# Patient Record
Sex: Female | Born: 1970 | Race: Black or African American | Hispanic: No | Marital: Single | Smoking: Never smoker
Health system: Southern US, Community
[De-identification: ages and names within clinical notes are randomized; demographics above are authoritative.]

## PROBLEM LIST (undated history)

## (undated) ENCOUNTER — Emergency Department (HOSPITAL_COMMUNITY): Admission: EM | Payer: BC Managed Care – PPO | Source: Home / Self Care

## (undated) DIAGNOSIS — T7840XA Allergy, unspecified, initial encounter: Secondary | ICD-10-CM

## (undated) DIAGNOSIS — I10 Essential (primary) hypertension: Secondary | ICD-10-CM

## (undated) DIAGNOSIS — F419 Anxiety disorder, unspecified: Secondary | ICD-10-CM

## (undated) DIAGNOSIS — D509 Iron deficiency anemia, unspecified: Secondary | ICD-10-CM

## (undated) DIAGNOSIS — F32A Depression, unspecified: Secondary | ICD-10-CM

## (undated) DIAGNOSIS — R7303 Prediabetes: Secondary | ICD-10-CM

## (undated) DIAGNOSIS — D3500 Benign neoplasm of unspecified adrenal gland: Secondary | ICD-10-CM

## (undated) DIAGNOSIS — Z9289 Personal history of other medical treatment: Secondary | ICD-10-CM

## (undated) DIAGNOSIS — E669 Obesity, unspecified: Secondary | ICD-10-CM

## (undated) DIAGNOSIS — N920 Excessive and frequent menstruation with regular cycle: Secondary | ICD-10-CM

## (undated) DIAGNOSIS — K219 Gastro-esophageal reflux disease without esophagitis: Secondary | ICD-10-CM

## (undated) HISTORY — DX: Iron deficiency anemia, unspecified: D50.9

## (undated) HISTORY — DX: Gastro-esophageal reflux disease without esophagitis: K21.9

## (undated) HISTORY — DX: Allergy, unspecified, initial encounter: T78.40XA

## (undated) HISTORY — DX: Prediabetes: R73.03

## (undated) HISTORY — PX: COLONOSCOPY: SHX174

## (undated) HISTORY — DX: Depression, unspecified: F32.A

## (undated) HISTORY — DX: Benign neoplasm of unspecified adrenal gland: D35.00

## (undated) HISTORY — PX: UPPER GASTROINTESTINAL ENDOSCOPY: SHX188

## (undated) HISTORY — DX: Obesity, unspecified: E66.9

## (undated) HISTORY — DX: Excessive and frequent menstruation with regular cycle: N92.0

## (undated) HISTORY — DX: Personal history of other medical treatment: Z92.89

---
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
## 1994-10-27 HISTORY — PX: OTHER SURGICAL HISTORY: SHX169

## 1998-02-26 ENCOUNTER — Inpatient Hospital Stay (HOSPITAL_COMMUNITY): Admission: AD | Admit: 1998-02-26 | Discharge: 1998-02-27 | Payer: Self-pay | Admitting: *Deleted

## 1998-03-02 ENCOUNTER — Inpatient Hospital Stay (HOSPITAL_COMMUNITY): Admission: AD | Admit: 1998-03-02 | Discharge: 1998-03-02 | Payer: Self-pay | Admitting: *Deleted

## 2000-11-17 ENCOUNTER — Emergency Department (HOSPITAL_COMMUNITY): Admission: EM | Admit: 2000-11-17 | Discharge: 2000-11-17 | Payer: Self-pay | Admitting: *Deleted

## 2004-10-27 HISTORY — PX: OTHER SURGICAL HISTORY: SHX169

## 2010-05-12 ENCOUNTER — Ambulatory Visit: Payer: Self-pay | Admitting: Gynecology

## 2010-05-12 ENCOUNTER — Inpatient Hospital Stay (HOSPITAL_COMMUNITY): Admission: AD | Admit: 2010-05-12 | Discharge: 2010-05-12 | Payer: Self-pay | Admitting: Obstetrics and Gynecology

## 2010-05-16 ENCOUNTER — Emergency Department (HOSPITAL_COMMUNITY): Admission: EM | Admit: 2010-05-16 | Discharge: 2010-05-16 | Payer: Self-pay | Admitting: Emergency Medicine

## 2010-05-26 ENCOUNTER — Emergency Department (HOSPITAL_COMMUNITY): Admission: EM | Admit: 2010-05-26 | Discharge: 2010-05-26 | Payer: Self-pay | Admitting: Emergency Medicine

## 2010-05-29 ENCOUNTER — Ambulatory Visit: Payer: Self-pay | Admitting: Internal Medicine

## 2010-06-05 LAB — COMPREHENSIVE METABOLIC PANEL
ALT: 32 U/L (ref 0–35)
AST: 22 U/L (ref 0–37)
Albumin: 4.5 g/dL (ref 3.5–5.2)
Alkaline Phosphatase: 116 U/L (ref 39–117)
Potassium: 4.7 mEq/L (ref 3.5–5.3)
Total Bilirubin: 0.3 mg/dL (ref 0.3–1.2)

## 2010-06-05 LAB — CBC WITH DIFFERENTIAL/PLATELET
Basophils Absolute: 0.1 10*3/uL (ref 0.0–0.1)
MCHC: 31.6 g/dL (ref 31.5–36.0)
Platelets: 390 10*3/uL (ref 145–400)

## 2010-06-05 LAB — FERRITIN: Ferritin: 30 ng/mL (ref 10–291)

## 2010-06-05 LAB — LACTATE DEHYDROGENASE: LDH: 151 U/L (ref 94–250)

## 2010-06-26 ENCOUNTER — Ambulatory Visit: Payer: Self-pay | Admitting: Obstetrics & Gynecology

## 2010-07-25 ENCOUNTER — Ambulatory Visit: Payer: Self-pay | Admitting: Internal Medicine

## 2010-07-29 LAB — CBC WITH DIFFERENTIAL/PLATELET
BASO%: 0.7 % (ref 0.0–2.0)
Basophils Absolute: 0 10*3/uL (ref 0.0–0.1)
EOS%: 1.4 % (ref 0.0–7.0)
Eosinophils Absolute: 0.1 10*3/uL (ref 0.0–0.5)
LYMPH%: 24 % (ref 14.0–49.7)
MCHC: 33.6 g/dL (ref 31.5–36.0)
MCV: 81.8 fL (ref 79.5–101.0)
MONO#: 0.5 10*3/uL (ref 0.1–0.9)
MONO%: 8.3 % (ref 0.0–14.0)
NEUT#: 4.1 10*3/uL (ref 1.5–6.5)
NEUT%: 65.6 % (ref 38.4–76.8)
RDW: 19.1 % — ABNORMAL HIGH (ref 11.2–14.5)
WBC: 6.2 10*3/uL (ref 3.9–10.3)
lymph#: 1.5 10*3/uL (ref 0.9–3.3)

## 2010-07-29 LAB — IRON AND TIBC
%SAT: 31 % (ref 20–55)
Iron: 113 ug/dL (ref 42–145)
TIBC: 369 ug/dL (ref 250–470)

## 2010-07-29 LAB — FERRITIN: Ferritin: 26 ng/mL (ref 10–291)

## 2011-01-11 LAB — COMPREHENSIVE METABOLIC PANEL
ALT: 14 U/L (ref 0–35)
Albumin: 4 g/dL (ref 3.5–5.2)
Albumin: 4.1 g/dL (ref 3.5–5.2)
Alkaline Phosphatase: 126 U/L — ABNORMAL HIGH (ref 39–117)
CO2: 23 mEq/L (ref 19–32)
CO2: 24 mEq/L (ref 19–32)
Calcium: 9.5 mg/dL (ref 8.4–10.5)
Calcium: 9.6 mg/dL (ref 8.4–10.5)
Chloride: 104 mEq/L (ref 96–112)
Creatinine, Ser: 0.77 mg/dL (ref 0.4–1.2)
GFR calc Af Amer: >60 mL/min (ref >60)
GFR calc Af Amer: >60 mL/min (ref >60)
Potassium: 3.6 mEq/L (ref 3.5–5.1)
Potassium: 3.7 mEq/L (ref 3.5–5.1)
Total Protein: 8.1 g/dL (ref 6.0–8.3)

## 2011-01-11 LAB — CBC
Hemoglobin: 10.1 g/dL — ABNORMAL LOW (ref 12.0–15.0)
Hemoglobin: 11.3 g/dL — ABNORMAL LOW (ref 12.0–15.0)
MCH: 22 pg — ABNORMAL LOW (ref 26.0–34.0)
MCH: 23.6 pg — ABNORMAL LOW (ref 26.0–34.0)
MCHC: 31.5 g/dL (ref 30.0–36.0)
MCHC: 32.7 g/dL (ref 30.0–36.0)
MCV: 69.8 fL — ABNORMAL LOW (ref 78.0–100.0)
MCV: 72.1 fL — ABNORMAL LOW (ref 78.0–100.0)
Platelets: 455 10*3/uL — ABNORMAL HIGH (ref 150–400)
Platelets: 579 10*3/uL — ABNORMAL HIGH (ref 150–400)
RBC: 4.61 MIL/uL (ref 3.87–5.11)
RDW: 18.9 % — ABNORMAL HIGH (ref 11.5–15.5)
WBC: 9.7 10*3/uL (ref 4.0–10.5)

## 2011-01-11 LAB — URINALYSIS, ROUTINE W REFLEX MICROSCOPIC
Bilirubin Urine: NEGATIVE
Glucose, UA: NEGATIVE mg/dL
Ketones, ur: 15 mg/dL — AB
Nitrite: NEGATIVE
Nitrite: NEGATIVE
Protein, ur: NEGATIVE mg/dL
Specific Gravity, Urine: <1.005 — ABNORMAL LOW (ref 1.005–1.030)
Urobilinogen, UA: 0.2 mg/dL (ref 0.0–1.0)
pH: 6.5 (ref 5.0–8.0)

## 2011-01-11 LAB — GC/CHLAMYDIA PROBE AMP, GENITAL: GC Probe Amp, Genital: NEGATIVE

## 2011-01-11 LAB — PREGNANCY, URINE: Preg Test, Ur: NEGATIVE

## 2011-01-11 LAB — DIFFERENTIAL
Basophils Relative: 0 % (ref 0–1)
Eosinophils Absolute: 0 10*3/uL (ref 0.0–0.7)
Lymphocytes Relative: 22 % (ref 12–46)
Monocytes Absolute: 0.4 10*3/uL (ref 0.1–1.0)
Monocytes Relative: 6 % (ref 3–12)

## 2011-01-11 LAB — POCT PREGNANCY, URINE: Preg Test, Ur: NEGATIVE

## 2011-01-11 LAB — URINE MICROSCOPIC-ADD ON

## 2011-01-11 LAB — LIPASE, BLOOD: Lipase: 33 U/L (ref 11–59)

## 2011-01-11 LAB — WET PREP, GENITAL: Trich, Wet Prep: NONE SEEN

## 2011-02-05 ENCOUNTER — Encounter: Payer: BC Managed Care – PPO | Attending: Family Medicine | Admitting: *Deleted

## 2011-02-05 DIAGNOSIS — Z713 Dietary counseling and surveillance: Secondary | ICD-10-CM | POA: Insufficient documentation

## 2011-02-05 DIAGNOSIS — E669 Obesity, unspecified: Secondary | ICD-10-CM | POA: Insufficient documentation

## 2011-03-05 ENCOUNTER — Encounter: Payer: BC Managed Care – PPO | Attending: Family Medicine | Admitting: *Deleted

## 2011-03-05 DIAGNOSIS — E669 Obesity, unspecified: Secondary | ICD-10-CM | POA: Insufficient documentation

## 2011-03-05 DIAGNOSIS — Z713 Dietary counseling and surveillance: Secondary | ICD-10-CM | POA: Insufficient documentation

## 2011-04-16 ENCOUNTER — Ambulatory Visit: Payer: BC Managed Care – PPO | Admitting: *Deleted

## 2011-06-18 ENCOUNTER — Emergency Department (HOSPITAL_COMMUNITY)
Admission: EM | Admit: 2011-06-18 | Discharge: 2011-06-18 | Disposition: A | Discharge status: Home / Self Care | Payer: BC Managed Care – PPO | Attending: Emergency Medicine | Admitting: Emergency Medicine

## 2011-06-18 ENCOUNTER — Emergency Department (HOSPITAL_COMMUNITY): Payer: BC Managed Care – PPO

## 2011-06-18 DIAGNOSIS — R000 Tachycardia, unspecified: Secondary | ICD-10-CM | POA: Insufficient documentation

## 2011-06-18 DIAGNOSIS — R42 Dizziness and giddiness: Secondary | ICD-10-CM | POA: Insufficient documentation

## 2011-06-18 DIAGNOSIS — J0130 Acute sphenoidal sinusitis, unspecified: Secondary | ICD-10-CM | POA: Insufficient documentation

## 2011-06-18 DIAGNOSIS — R51 Headache: Secondary | ICD-10-CM | POA: Insufficient documentation

## 2011-06-18 LAB — COMPREHENSIVE METABOLIC PANEL
ALT: 25 U/L (ref 0–35)
Albumin: 3.8 g/dL (ref 3.5–5.2)
Alkaline Phosphatase: 117 U/L (ref 39–117)
BUN: 11 mg/dL (ref 6–23)
Chloride: 101 mEq/L (ref 96–112)
Glucose, Bld: 96 mg/dL (ref 70–99)
Potassium: 3.6 mEq/L (ref 3.5–5.1)
Sodium: 137 mEq/L (ref 135–145)
Total Bilirubin: 0.3 mg/dL (ref 0.3–1.2)

## 2011-06-18 LAB — DIFFERENTIAL
Basophils Absolute: 0 10*3/uL (ref 0.0–0.1)
Lymphocytes Relative: 14 % (ref 12–46)
Neutro Abs: 6.4 10*3/uL (ref 1.7–7.7)

## 2011-06-18 LAB — CBC
HCT: 39.6 % (ref 36.0–46.0)
Hemoglobin: 13 g/dL (ref 12.0–15.0)
RDW: 13.5 % (ref 11.5–15.5)
WBC: 8.2 10*3/uL (ref 4.0–10.5)

## 2011-06-18 LAB — T4, FREE: Free T4: 1.15 ng/dL (ref 0.80–1.80)

## 2011-06-18 LAB — POCT PREGNANCY, URINE: Preg Test, Ur: NEGATIVE

## 2011-10-28 HISTORY — PX: NASAL SINUS SURGERY: SHX719

## 2011-12-09 ENCOUNTER — Emergency Department (HOSPITAL_COMMUNITY): Payer: BC Managed Care – PPO

## 2011-12-09 ENCOUNTER — Encounter (HOSPITAL_COMMUNITY): Payer: Self-pay | Admitting: *Deleted

## 2011-12-09 ENCOUNTER — Emergency Department (HOSPITAL_COMMUNITY)
Admission: EM | Admit: 2011-12-09 | Discharge: 2011-12-09 | Disposition: A | Discharge status: Home / Self Care | Payer: BC Managed Care – PPO | Attending: Emergency Medicine | Admitting: Emergency Medicine

## 2011-12-09 ENCOUNTER — Other Ambulatory Visit: Payer: Self-pay

## 2011-12-09 DIAGNOSIS — R109 Unspecified abdominal pain: Secondary | ICD-10-CM | POA: Insufficient documentation

## 2011-12-09 DIAGNOSIS — I10 Essential (primary) hypertension: Secondary | ICD-10-CM | POA: Insufficient documentation

## 2011-12-09 DIAGNOSIS — R079 Chest pain, unspecified: Secondary | ICD-10-CM | POA: Insufficient documentation

## 2011-12-09 DIAGNOSIS — F411 Generalized anxiety disorder: Secondary | ICD-10-CM | POA: Insufficient documentation

## 2011-12-09 DIAGNOSIS — R10816 Epigastric abdominal tenderness: Secondary | ICD-10-CM | POA: Insufficient documentation

## 2011-12-09 DIAGNOSIS — Z79899 Other long term (current) drug therapy: Secondary | ICD-10-CM | POA: Insufficient documentation

## 2011-12-09 HISTORY — DX: Essential (primary) hypertension: I10

## 2011-12-09 HISTORY — DX: Anxiety disorder, unspecified: F41.9

## 2011-12-09 LAB — COMPREHENSIVE METABOLIC PANEL
Albumin: 3.7 g/dL (ref 3.5–5.2)
BUN: 14 mg/dL (ref 6–23)
Calcium: 9.6 mg/dL (ref 8.4–10.5)
Creatinine, Ser: 0.93 mg/dL (ref 0.50–1.10)
GFR calc Af Amer: 89 mL/min — ABNORMAL LOW (ref >90)
Glucose, Bld: 103 mg/dL — ABNORMAL HIGH (ref 70–99)
Total Protein: 8 g/dL (ref 6.0–8.3)

## 2011-12-09 LAB — DIFFERENTIAL
Basophils Relative: 0 % (ref 0–1)
Eosinophils Absolute: 0.1 10*3/uL (ref 0.0–0.7)
Eosinophils Relative: 1 % (ref 0–5)
Lymphs Abs: 2.3 10*3/uL (ref 0.7–4.0)
Monocytes Absolute: 1 10*3/uL (ref 0.1–1.0)
Monocytes Relative: 10 % (ref 3–12)
Neutrophils Relative %: 64 % (ref 43–77)

## 2011-12-09 LAB — CBC
Hemoglobin: 12.3 g/dL (ref 12.0–15.0)
MCH: 26.7 pg (ref 26.0–34.0)
MCHC: 33.1 g/dL (ref 30.0–36.0)
MCV: 80.7 fL (ref 78.0–100.0)

## 2011-12-09 LAB — POCT I-STAT TROPONIN I: Troponin i, poc: 0 ng/mL (ref 0.00–0.08)

## 2011-12-09 MED ORDER — PANTOPRAZOLE SODIUM 20 MG PO TBEC: 20.0000 mg | DELAYED_RELEASE_TABLET | Freq: Every day | ORAL | Status: DC

## 2011-12-09 MED ORDER — GI COCKTAIL ~~LOC~~
ORAL | Status: AC
  Filled 2011-12-09: qty 30

## 2011-12-09 MED ORDER — GI COCKTAIL ~~LOC~~
30.0000 mL | Freq: Once | ORAL | Status: AC
  Administered 2011-12-09: 30 mL via ORAL

## 2011-12-09 NOTE — ED Notes (Signed)
Pt in c/o intermittent chest pain since April, states she was working out and pain started then, has been evaluated for same in the past, this episode started 3 days ago, describes as heaviness also some nausea

## 2011-12-09 NOTE — ED Provider Notes (Signed)
History     CSN: [id number]  Arrival date & time 12/09/11  1928   First MD Initiated Contact with Patient 12/09/11 2109      Chief Complaint  Patient presents with  . Chest Pain    (Consider location/radiation/quality/duration/timing/severity/associated sxs/prior treatment) Patient is a 41 y.o. female presenting with chest pain. The history is provided by the patient (The patient complains of periodic pain to her abdomen and chest. Patient points to her xiphoid she says the pain sometimes goes into her back no nausea or vomiting. The pain is not increased by exercise). No language interpreter was used.  Chest Pain The chest pain began more than 2 weeks ago. Chest pain occurs frequently. The chest pain is resolved. The pain is associated with stress. At its most intense, the pain is at 5/10. The pain is currently at 2/10. The severity of the pain is moderate. The quality of the pain is described as aching. The pain does not radiate. Chest pain is worsened by stress. Pertinent negatives for primary symptoms include no fever, no fatigue, no cough and no abdominal pain. She tried nothing for the symptoms. Risk factors include no known risk factors.  Pertinent negatives for past medical history include no aneurysm and no seizures.     Past Medical History  Diagnosis Date  . Hypertension   . Anxiety   . Anemia     History reviewed. No pertinent past surgical history.  History reviewed. No pertinent family history.  History  Substance Use Topics  . Smoking status: Never Smoker   . Smokeless tobacco: Not on file  . Alcohol Use: No    OB History    Grav Para Term Preterm Abortions TAB SAB Ect Mult Living                  Review of Systems  Constitutional: Negative for fever and fatigue.  HENT: Negative for congestion, sinus pressure and ear discharge.   Eyes: Negative for discharge.  Respiratory: Negative for cough.   Cardiovascular: Positive for chest pain.    Gastrointestinal: Negative for abdominal pain and diarrhea.  Genitourinary: Negative for frequency and hematuria.  Musculoskeletal: Negative for back pain.  Skin: Negative for rash.  Neurological: Negative for seizures and headaches.  Hematological: Negative.   Psychiatric/Behavioral: Negative for hallucinations.    Allergies  Review of patient's allergies indicates no known allergies.  Home Medications   Current Outpatient Rx  Name Route Sig Dispense Refill  . BC HEADACHE POWDER PO Oral Take 1-2 packets by mouth daily as needed. For pain.    Marland Kitchen FOLIVANE-PLUS PO Oral Take 1 capsule by mouth daily.    Marland Kitchen FERROUS SULFATE 325 (65 FE) MG PO TABS Oral Take 325 mg by mouth as directed.    . ADULT MULTIVITAMIN W/MINERALS CH Oral Take 1 tablet by mouth daily.    Marland Kitchen PANTOPRAZOLE SODIUM 20 MG PO TBEC Oral Take 1 tablet (20 mg total) by mouth daily. 30 tablet 1    BP 170/107  Pulse 120  Temp(Src) 98 F (36.7 C) (Oral)  Resp 20  SpO2 100%  LMP 11/24/2011  Physical Exam  Constitutional: She is oriented to person, place, and time. She appears well-developed.  HENT:  Head: Normocephalic and atraumatic.  Eyes: Conjunctivae and EOM are normal. No scleral icterus.  Neck: Neck supple. No thyromegaly present.  Cardiovascular: Normal rate and regular rhythm.  Exam reveals no gallop and no friction rub.   No murmur heard. Pulmonary/Chest: No stridor.  She has no wheezes. She has no rales. She exhibits no tenderness.  Abdominal: She exhibits no distension. There is tenderness. There is no rebound.       Mild tendernous epigastric area  Musculoskeletal: Normal range of motion. She exhibits no edema.  Lymphadenopathy:    She has no cervical adenopathy.  Neurological: She is oriented to person, place, and time. Coordination normal.  Skin: No rash noted. No erythema.  Psychiatric: She has a normal mood and affect. Her behavior is normal.    ED Course  Procedures (including critical care  time)  Labs Reviewed  CBC - Abnormal; Notable for the following:    Platelets 404 (*)    All other components within normal limits  COMPREHENSIVE METABOLIC PANEL - Abnormal; Notable for the following:    Glucose, Bld 103 (*)    Alkaline Phosphatase 128 (*)    Total Bilirubin 0.2 (*)    GFR calc non Af Amer 77 (*)    GFR calc Af Amer 89 (*)    All other components within normal limits  DIFFERENTIAL  POCT I-STAT TROPONIN I   Dg Chest 2 View  12/09/2011  *RADIOLOGY REPORT*  Clinical Data: Chest pain.  CHEST - 2 VIEW  Comparison: None available.  Findings: The heart is mildly enlarged.  There is no edema or effusion to suggest failure.  No focal airspace disease evident. There is slight AP expansion of the chest.  IMPRESSION:  1.  Mild cardiomegaly without failure. 2.  Emphysema.  Original Report Authenticated By: Jamesetta Orleans. MATTERN, M.D.    Date: 12/09/2011  Rate: 108  Rhythm: normal sinus rhythm  QRS Axis: normal  Intervals: normal  ST/T Wave abnormalities: normal  Conduction Disutrbances:none  Narrative Interpretation:   Old EKG Reviewed: none available    1. Abdominal pain    Pt improved with tx in er   MDM  Abdominal pain.  pud .  gb.        Benny Lennert, MD 12/09/11 2238

## 2011-12-09 NOTE — Discharge Instructions (Signed)
Follow up with your md in 1-2 weeks. °

## 2012-09-11 IMAGING — CT CT HEAD W/O CM
1 series · 16 of 30 positions shown, 20 images · non-contrast
Comparison: None.

CLINICAL DATA: Headache and dizziness

CT HEAD WITHOUT CONTRAST
TECHNIQUE: Contiguous axial images were obtained from the base of
the skull through the vertex without contrast.

[Series 2: headseq 4.8 h45s · axial · 0.43mm/px · z∈[-150,-22]mm · 16 of 30 slices shown, 20 images]
[im 2/30  brain]
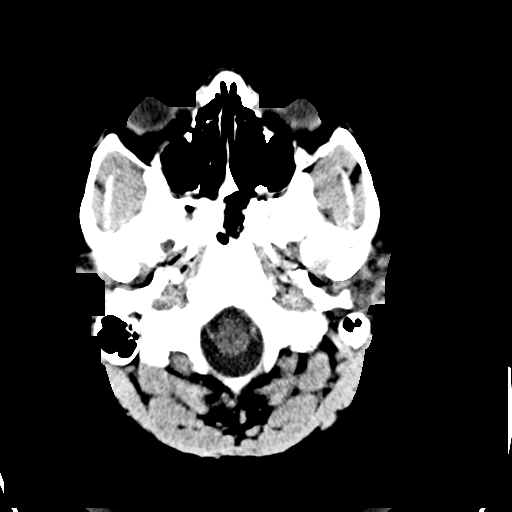
[im 2/30  bone]
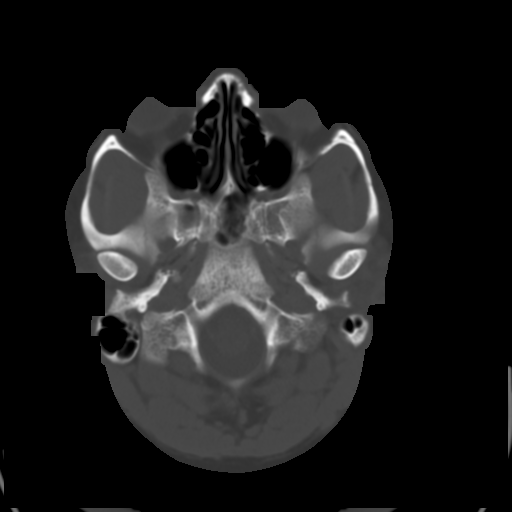
[im 4/30  brain]
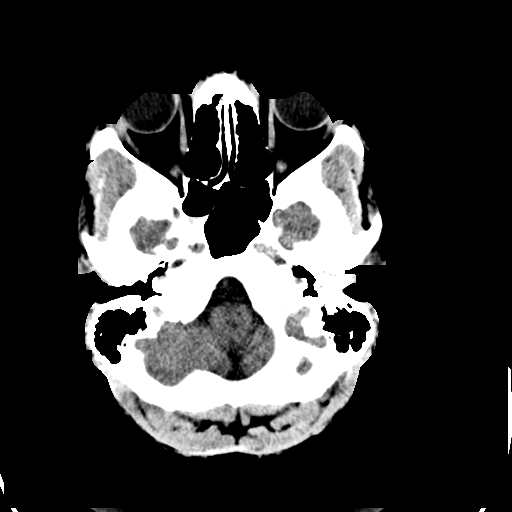
[im 6/30  brain]
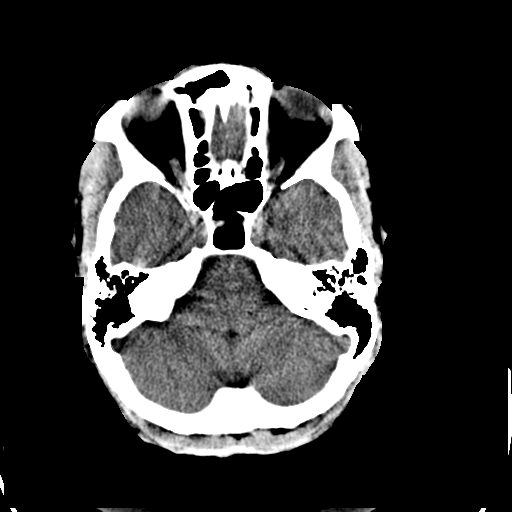
[im 8/30  brain]
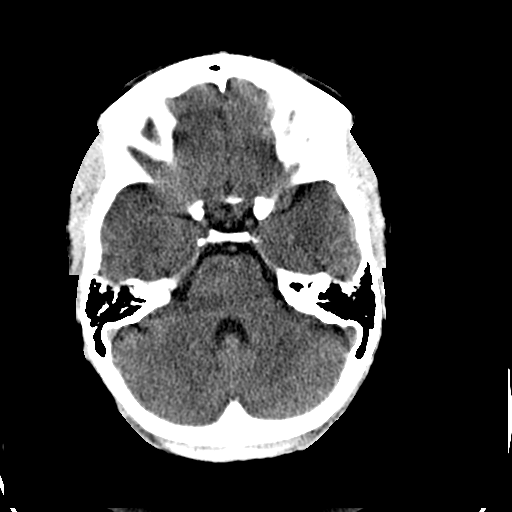
[im 9/30  brain]
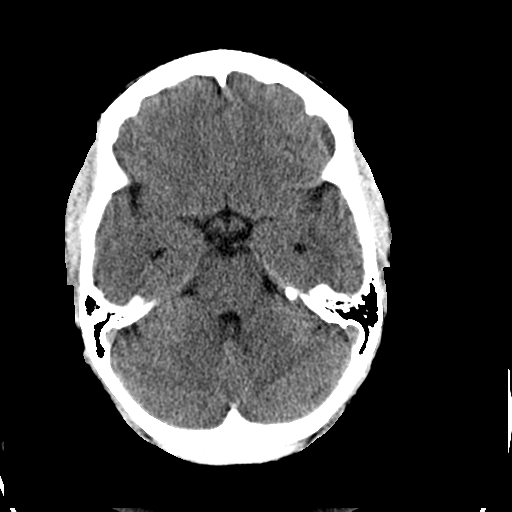
[im 9/30  bone]
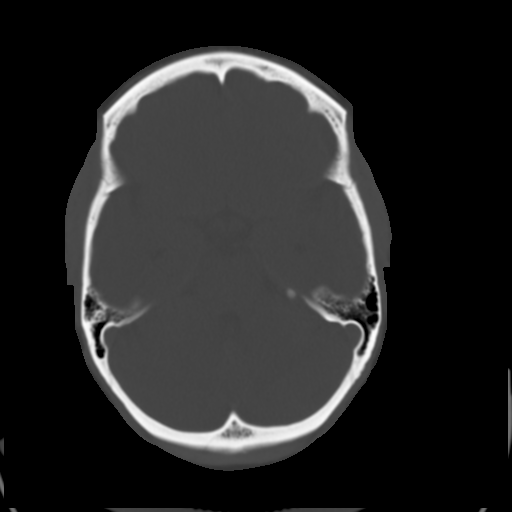
[im 11/30  brain]
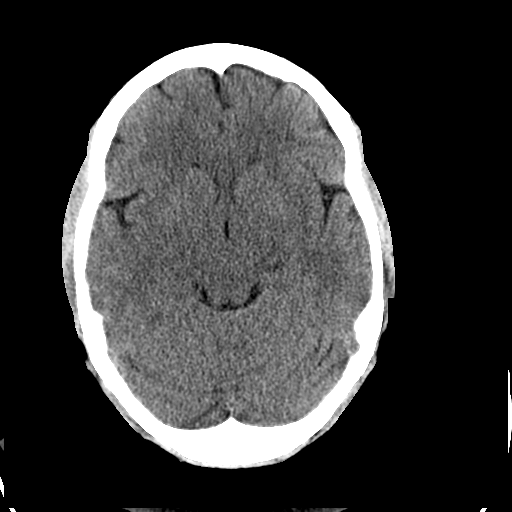
[im 13/30  brain]
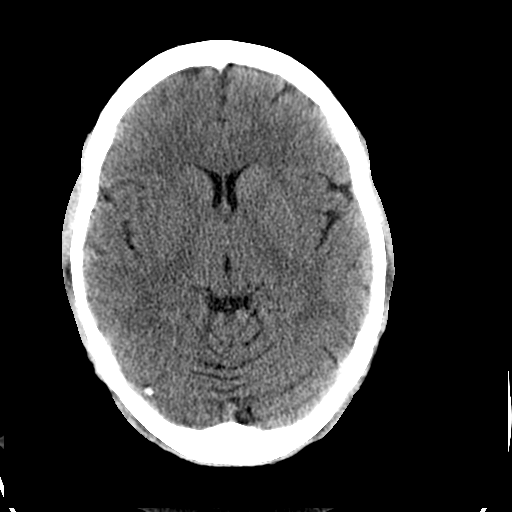
[im 15/30  brain]
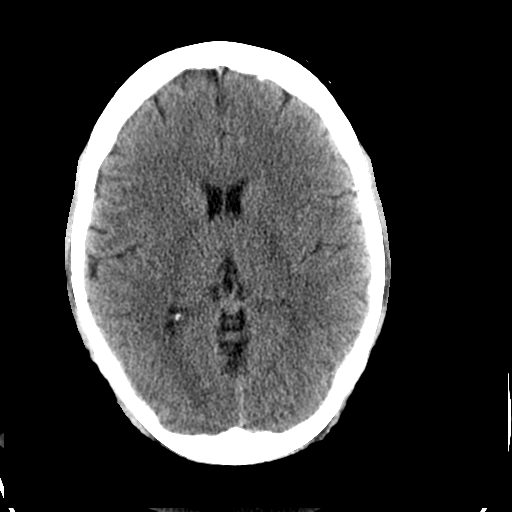
[im 16/30  brain]
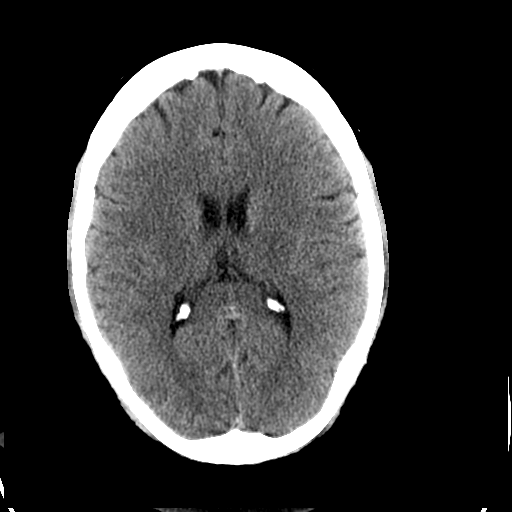
[im 16/30  bone]
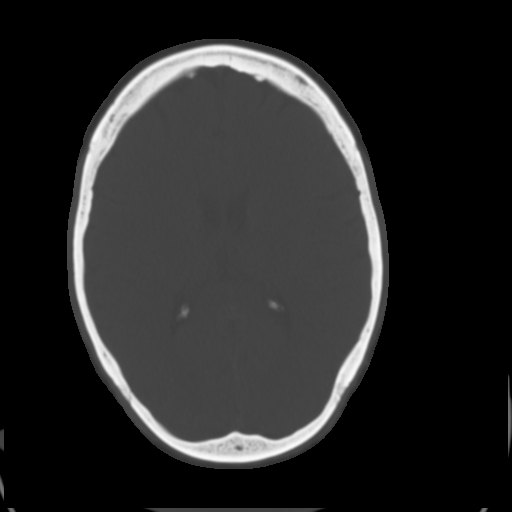
[im 18/30  brain]
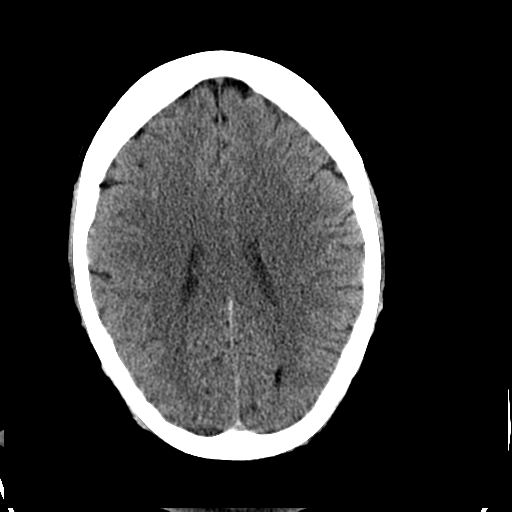
[im 20/30  brain]
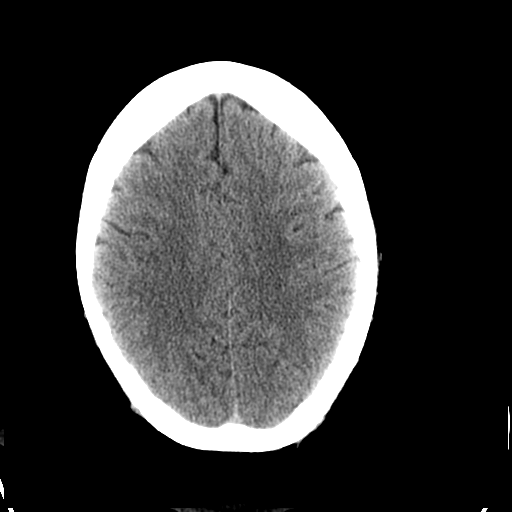
[im 22/30  brain]
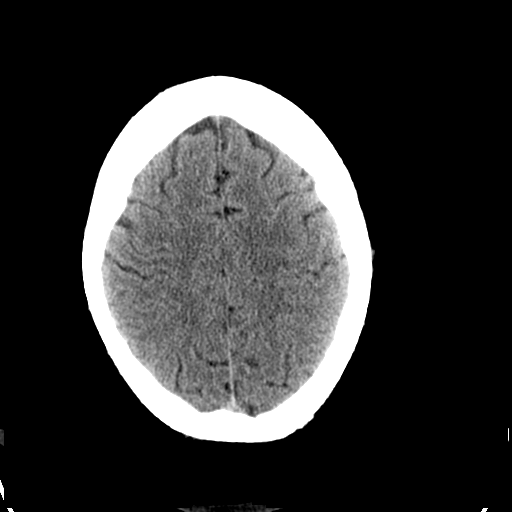
[im 23/30  brain]
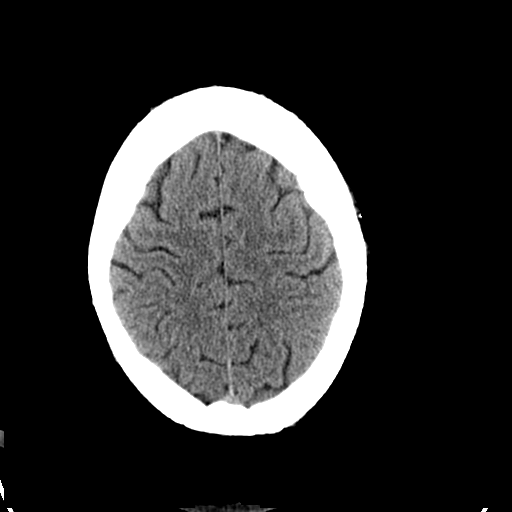
[im 23/30  bone]
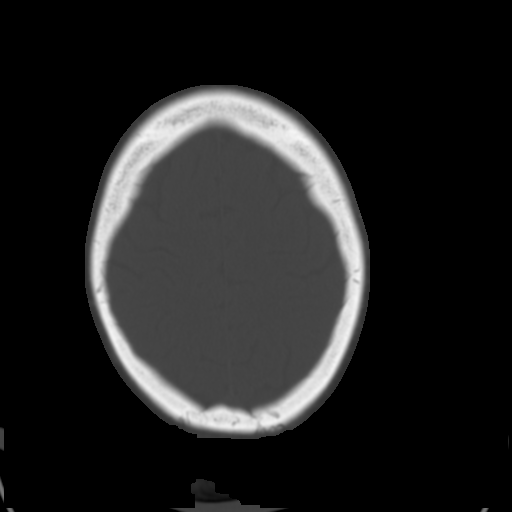
[im 25/30  brain]
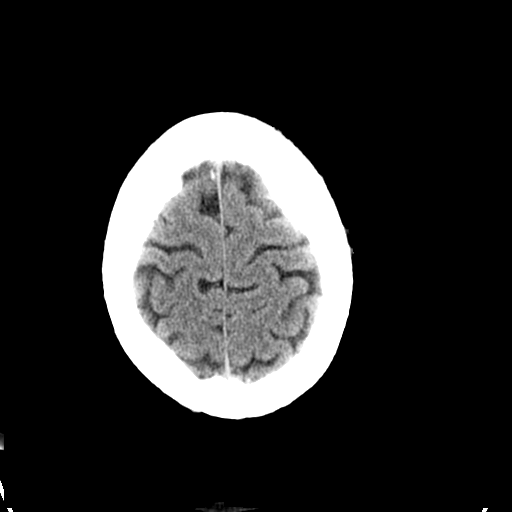
[im 27/30  brain]
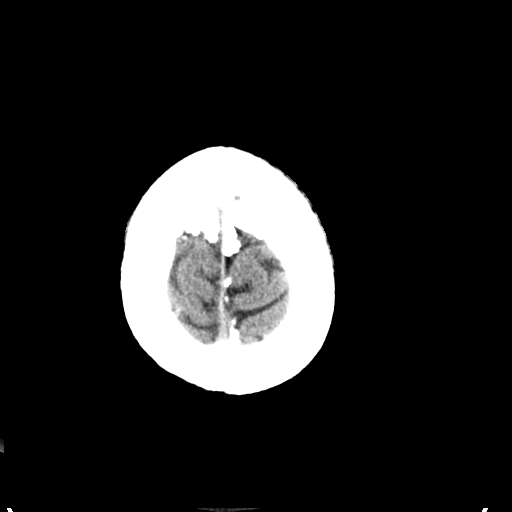
[im 29/30  brain]
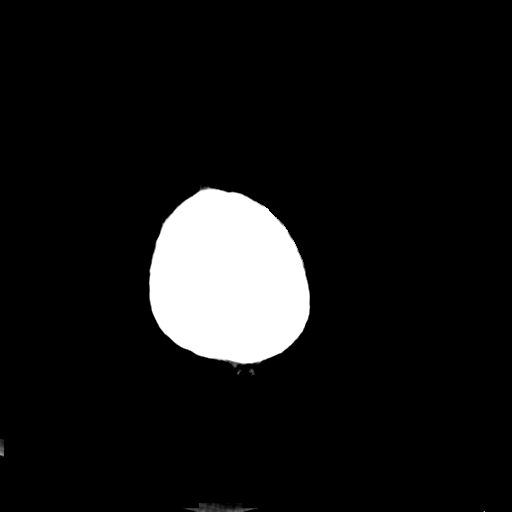

[16 of 30 positions shown; findings below may reference images not displayed]

FINDINGS: There is no evidence for acute hemorrhage, hydrocephalus,
mass lesion, or abnormal extra-axial fluid collection.  No definite
CT evidence for acute infarction.  Air-fluid levels seen in the
right sphenoid sinus.
IMPRESSION: No acute intracranial abnormality.

Acute right sphenoid sinusitis.

## 2013-03-04 IMAGING — CR DG CHEST 2V
2 series · 2 of 2 positions shown · non-contrast
Comparison: None available.

CLINICAL DATA: Chest pain.

CHEST - 2 VIEW

[w chest pa]
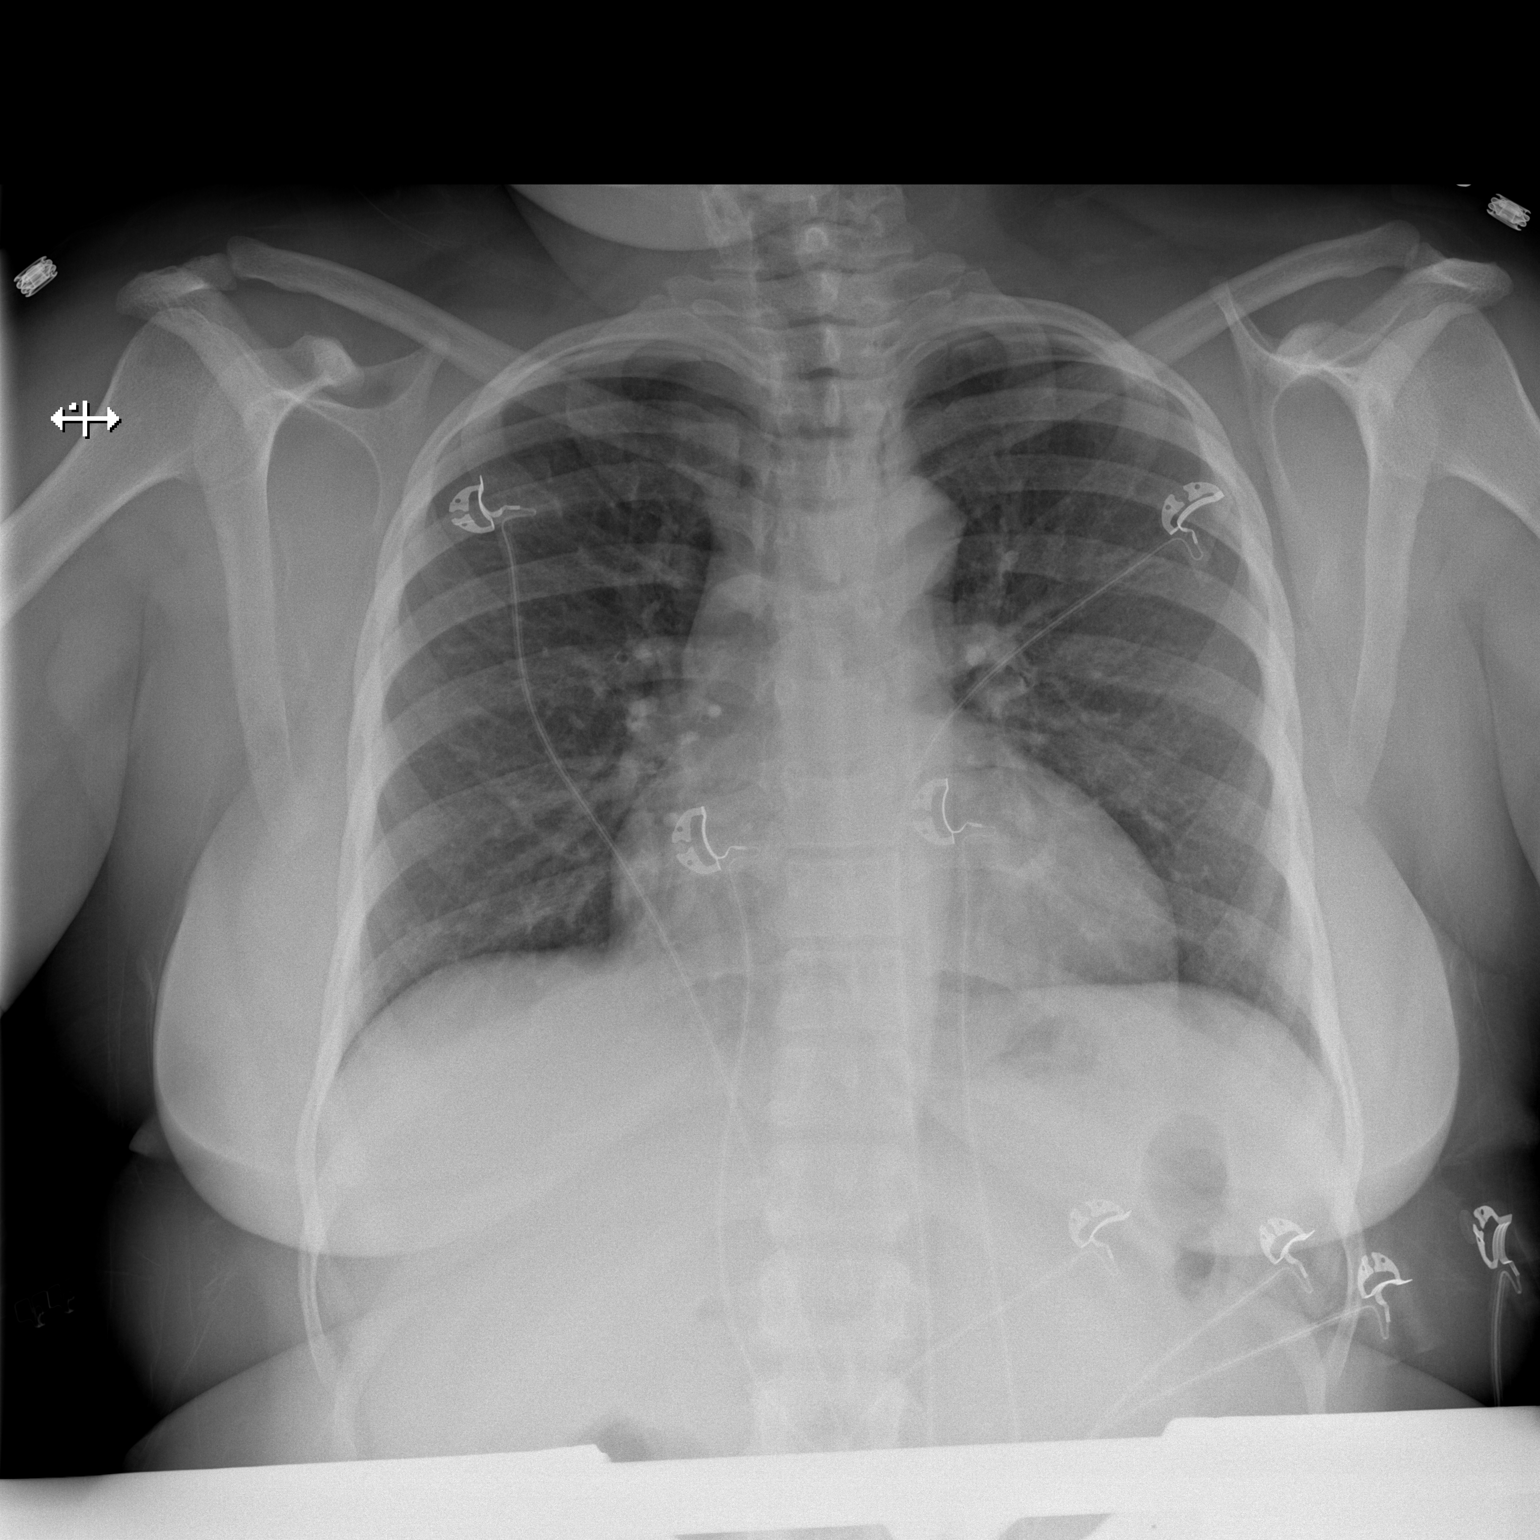

[w chest lat]
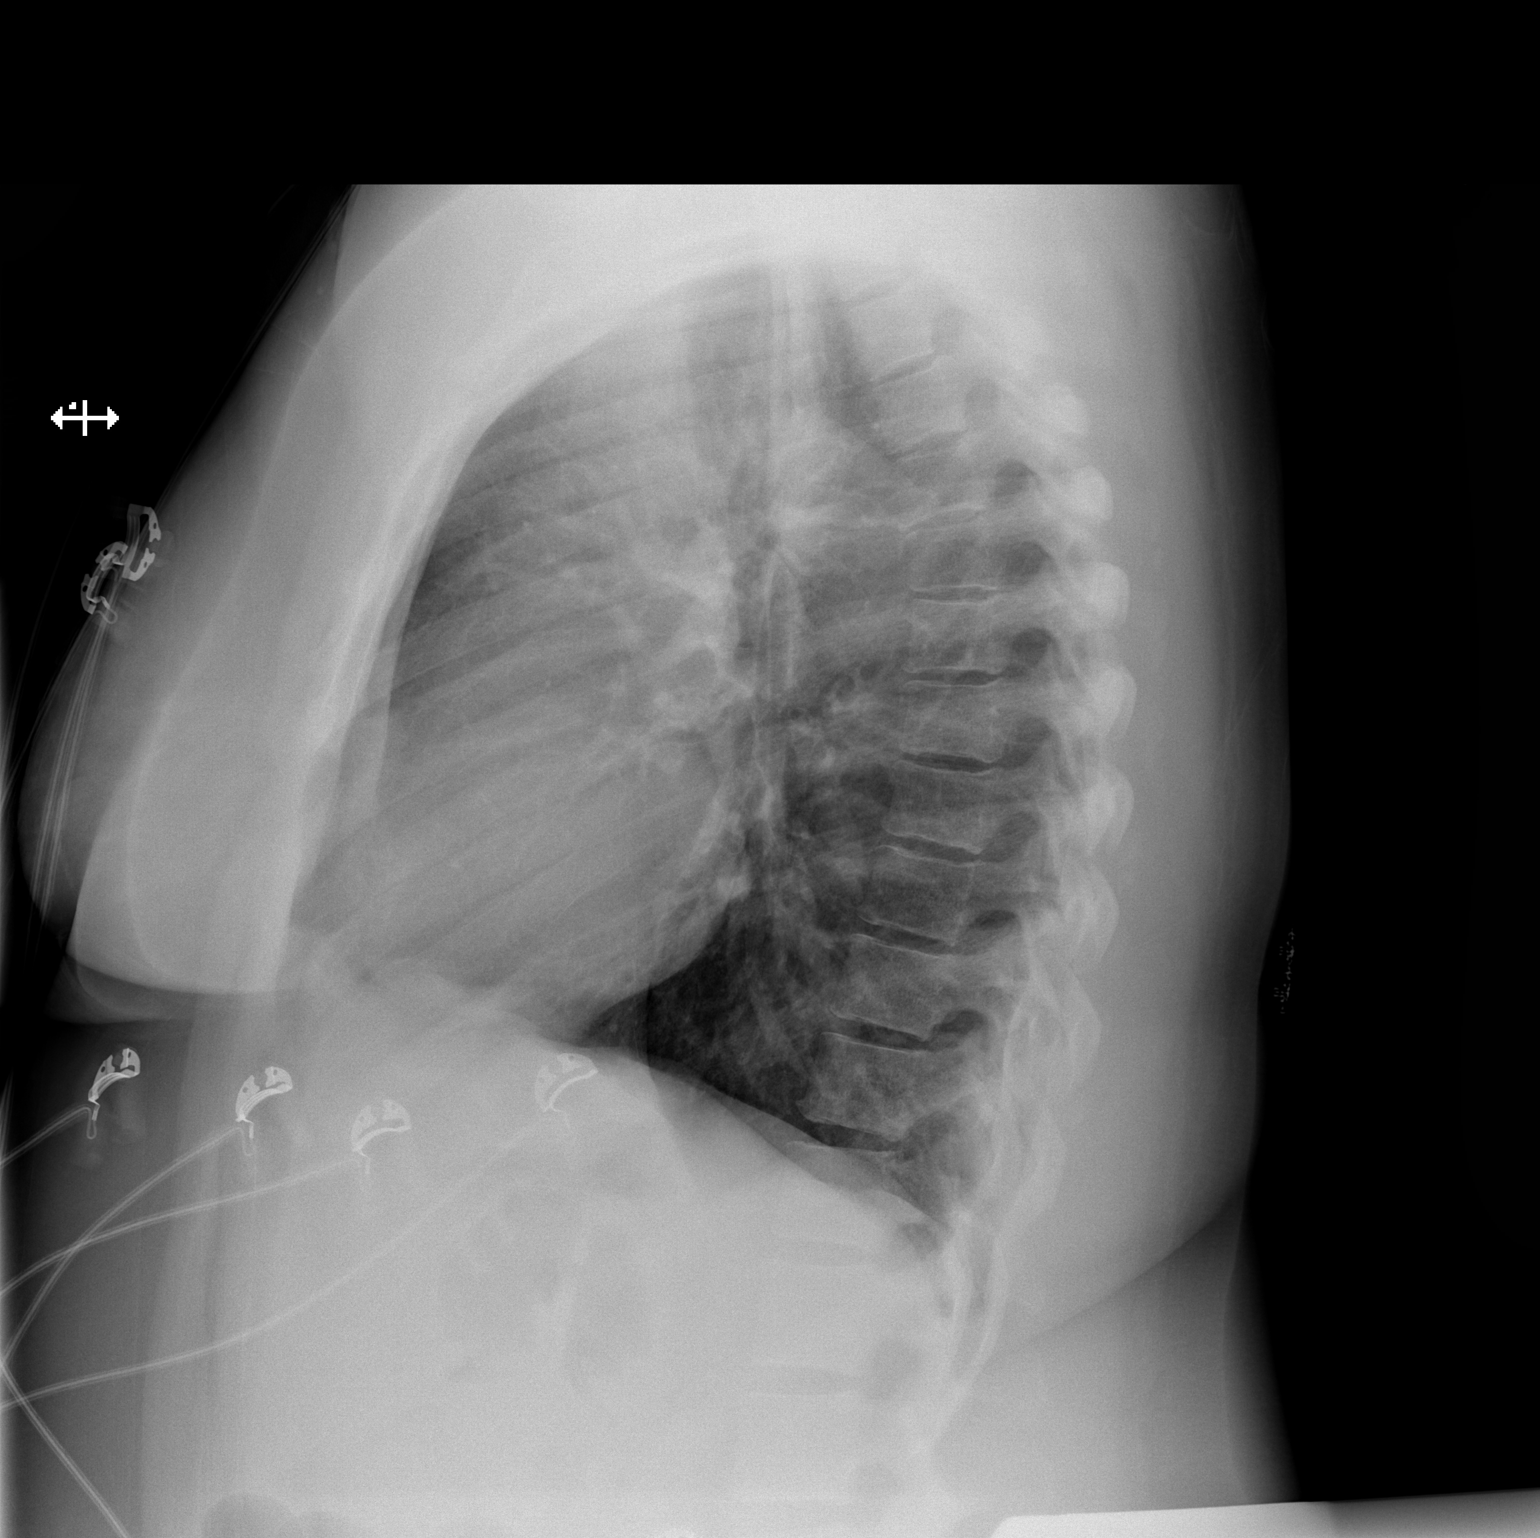

[2 of 2 positions shown; findings below may reference images not displayed]

FINDINGS: The heart is mildly enlarged.  There is no edema or
effusion to suggest failure.  No focal airspace disease evident.
There is slight AP expansion of the chest.
IMPRESSION: 1.  Mild cardiomegaly without failure.
2.  Emphysema.

## 2014-10-12 ENCOUNTER — Encounter (HOSPITAL_COMMUNITY): Payer: Self-pay | Admitting: *Deleted

## 2014-10-12 ENCOUNTER — Emergency Department (HOSPITAL_COMMUNITY): Payer: BC Managed Care – PPO

## 2014-10-12 ENCOUNTER — Emergency Department (HOSPITAL_COMMUNITY)
Admission: EM | Admit: 2014-10-12 | Discharge: 2014-10-12 | Disposition: A | Discharge status: Home / Self Care | Payer: BC Managed Care – PPO | Attending: Emergency Medicine | Admitting: Emergency Medicine

## 2014-10-12 DIAGNOSIS — I10 Essential (primary) hypertension: Secondary | ICD-10-CM | POA: Diagnosis not present

## 2014-10-12 DIAGNOSIS — Z8659 Personal history of other mental and behavioral disorders: Secondary | ICD-10-CM | POA: Diagnosis not present

## 2014-10-12 DIAGNOSIS — Z79899 Other long term (current) drug therapy: Secondary | ICD-10-CM | POA: Insufficient documentation

## 2014-10-12 DIAGNOSIS — R109 Unspecified abdominal pain: Secondary | ICD-10-CM | POA: Diagnosis not present

## 2014-10-12 DIAGNOSIS — Z3202 Encounter for pregnancy test, result negative: Secondary | ICD-10-CM | POA: Diagnosis not present

## 2014-10-12 DIAGNOSIS — D649 Anemia, unspecified: Secondary | ICD-10-CM | POA: Insufficient documentation

## 2014-10-12 DIAGNOSIS — Z7982 Long term (current) use of aspirin: Secondary | ICD-10-CM | POA: Diagnosis not present

## 2014-10-12 LAB — CBC WITH DIFFERENTIAL/PLATELET
BASOS ABS: 0 10*3/uL (ref 0.0–0.1)
Basophils Relative: 0 % (ref 0–1)
EOS PCT: 1 % (ref 0–5)
Eosinophils Absolute: 0 10*3/uL (ref 0.0–0.7)
HEMATOCRIT: 37.1 % (ref 36.0–46.0)
Hemoglobin: 12 g/dL (ref 12.0–15.0)
LYMPHS PCT: 23 % (ref 12–46)
Lymphs Abs: 1.9 10*3/uL (ref 0.7–4.0)
MCH: 27.1 pg (ref 26.0–34.0)
MCHC: 32.3 g/dL (ref 30.0–36.0)
MCV: 83.7 fL (ref 78.0–100.0)
MONO ABS: 0.8 10*3/uL (ref 0.1–1.0)
Monocytes Relative: 9 % (ref 3–12)
Neutro Abs: 5.6 10*3/uL (ref 1.7–7.7)
Neutrophils Relative %: 67 % (ref 43–77)
Platelets: 376 10*3/uL (ref 150–400)
RBC: 4.43 MIL/uL (ref 3.87–5.11)
RDW: 13.6 % (ref 11.5–15.5)
WBC: 8.3 10*3/uL (ref 4.0–10.5)

## 2014-10-12 LAB — COMPREHENSIVE METABOLIC PANEL
ALT: 16 U/L (ref 0–35)
ANION GAP: 15 (ref 5–15)
AST: 20 U/L (ref 0–37)
Albumin: 3.7 g/dL (ref 3.5–5.2)
Alkaline Phosphatase: 116 U/L (ref 39–117)
BUN: 12 mg/dL (ref 6–23)
CALCIUM: 9.3 mg/dL (ref 8.4–10.5)
CO2: 22 meq/L (ref 19–32)
CREATININE: 0.87 mg/dL (ref 0.50–1.10)
Chloride: 100 mEq/L (ref 96–112)
GFR, EST NON AFRICAN AMERICAN: 82 mL/min — AB (ref >90)
Glucose, Bld: 100 mg/dL — ABNORMAL HIGH (ref 70–99)
Potassium: 3.6 mEq/L — ABNORMAL LOW (ref 3.7–5.3)
Sodium: 137 mEq/L (ref 137–147)
Total Bilirubin: 0.3 mg/dL (ref 0.3–1.2)
Total Protein: 7.9 g/dL (ref 6.0–8.3)

## 2014-10-12 LAB — URINALYSIS, ROUTINE W REFLEX MICROSCOPIC
BILIRUBIN URINE: NEGATIVE
Glucose, UA: NEGATIVE mg/dL
KETONES UR: NEGATIVE mg/dL
Leukocytes, UA: NEGATIVE
NITRITE: NEGATIVE
PROTEIN: NEGATIVE mg/dL
SPECIFIC GRAVITY, URINE: 1.022 (ref 1.005–1.030)
UROBILINOGEN UA: 0.2 mg/dL (ref 0.0–1.0)
pH: 6 (ref 5.0–8.0)

## 2014-10-12 LAB — URINE MICROSCOPIC-ADD ON

## 2014-10-12 LAB — LIPASE, BLOOD: LIPASE: 19 U/L (ref 11–59)

## 2014-10-12 MED ORDER — HYDROCODONE-ACETAMINOPHEN 5-325 MG PO TABS
1.0000 | ORAL_TABLET | Freq: Four times a day (QID) | ORAL | Status: DC | PRN
Start: 2014-10-12 — End: 2015-12-13

## 2014-10-12 NOTE — Discharge Instructions (Signed)
Your CT scan did not show a cause of your side pain.  It showed an adenoma (benign growth) on your right adrenal gland.  This should be followed up by your doctor.   Flank Pain Flank pain refers to pain that is located on the side of the body between the upper abdomen and the back. The pain may occur over a short period of time (acute) or may be long-term or reoccurring (chronic). It may be mild or severe. Flank pain can be caused by many things. CAUSES  Some of the more common causes of flank pain include:  Muscle strains.   Muscle spasms.   A disease of your spine (vertebral disk disease).   A lung infection (pneumonia).   Fluid around your lungs (pulmonary edema).   A kidney infection.   Kidney stones.   A very painful skin rash caused by the chickenpox virus (shingles).   Gallbladder disease.  Perkins care will depend on the cause of your pain. In general,  Rest as directed by your caregiver.  Drink enough fluids to keep your urine clear or pale yellow.  Only take over-the-counter or prescription medicines as directed by your caregiver. Some medicines may help relieve the pain.  Tell your caregiver about any changes in your pain.  Follow up with your caregiver as directed. SEEK IMMEDIATE MEDICAL CARE IF:   Your pain is not controlled with medicine.   You have new or worsening symptoms.  Your pain increases.   You have abdominal pain.   You have shortness of breath.   You have persistent nausea or vomiting.   You have swelling in your abdomen.   You feel faint or pass out.   You have blood in your urine.  You have a fever or persistent symptoms for more than 2-3 days.  You have a fever and your symptoms suddenly get worse. MAKE SURE YOU:   Understand these instructions.  Will watch your condition.  Will get help right away if you are not doing well or get worse. Document Released: 12/04/2005 Document Revised:  07/07/2012 Document Reviewed: 05/27/2012 [facility] Patient Information 2015 [place], [place]. This information is not intended to replace advice given to you by your health care provider. Make sure you discuss any questions you have with your health care provider.

## 2014-10-12 NOTE — ED Notes (Signed)
Pt states she took her blood pressure medication on Tuesday evening, states she was told to stop taking it so last time before Tuesday she took it was month ago, Pt states she doesn't take her medications like she is suppose to d/t dizziness, states she is having R flank pain, states also urine frequency.

## 2014-10-12 NOTE — ED Provider Notes (Signed)
CSN: [id number]     Arrival date & time 10/12/14  2003 History   First MD Initiated Contact with Patient 10/12/14 2057     Chief Complaint  Patient presents with  . Abdominal Pain      Patient is a 43 y.o. female presenting with abdominal pain. The history is provided by the patient. No language interpreter was used.  Abdominal Pain  Ms. Huckeba presents for evaluation of right flank pain. She reports 2 days of right-sided abdominal pain and flank pain. Pain is constant and it is described as "pain". Symptoms occasionally radiate to the right lower quadrant. She denies any dysuria, fevers, nausea, vomiting, vaginal discharge. She has a history of tubal ligation and anemia. She has a previous history of hypertension but she is not on any blood pressure medications at this time. Symptoms are moderate, constant, worsening. She has urinary frequency.  Past Medical History  Diagnosis Date  . Hypertension   . Anxiety   . Anemia    History reviewed. No pertinent past surgical history. No family history on file. History  Substance Use Topics  . Smoking status: Never Smoker   . Smokeless tobacco: Not on file  . Alcohol Use: No   OB History    No data available     Review of Systems  Gastrointestinal: Positive for abdominal pain.  All other systems reviewed and are negative.     Allergies  Review of patient's allergies indicates no known allergies.  Home Medications   Prior to Admission medications   Medication Sig Start Date End Date Taking? Authorizing Provider  Aspirin-Salicylamide-Caffeine (BC HEADACHE POWDER PO) Take 1-2 packets by mouth daily as needed. For pain.    Historical Provider, MD  FeFum-FePoly-FA-B Cmp-C-Biot (FOLIVANE-PLUS PO) Take 1 capsule by mouth daily.    Historical Provider, MD  ferrous sulfate 325 (65 FE) MG tablet Take 325 mg by mouth as directed.    Historical Provider, MD  Multiple Vitamin (MULITIVITAMIN WITH MINERALS) TABS Take 1 tablet by mouth  daily.    Historical Provider, MD  pantoprazole (PROTONIX) 20 MG tablet Take 1 tablet (20 mg total) by mouth daily. 12/09/11 12/08/12  Maudry Diego, MD   BP 127/74 mmHg  Pulse 103  Temp(Src) 98.3 F (36.8 C) (Oral)  Resp 16  SpO2 100%  LMP 09/28/2014 Physical Exam  Constitutional: She is oriented to person, place, and time. She appears well-developed and well-nourished.  HENT:  Head: Normocephalic and atraumatic.  Cardiovascular: Normal rate and regular rhythm.   No murmur heard. Pulmonary/Chest: Effort normal and breath sounds normal. No respiratory distress.  Abdominal: Soft.  Mild right CVA and right-sided abdominal tenderness without guarding or rebound.  Musculoskeletal: She exhibits no edema or tenderness.  Neurological: She is alert and oriented to person, place, and time.  Skin: Skin is warm and dry.  Psychiatric: She has a normal mood and affect. Her behavior is normal.  Nursing note and vitals reviewed.   ED Course  Procedures (including critical care time) Labs Review Labs Reviewed  COMPREHENSIVE METABOLIC PANEL - Abnormal; Notable for the following:    Potassium 3.6 (*)    Glucose, Bld 100 (*)    GFR calc non Af Amer 82 (*)    All other components within normal limits  URINALYSIS, ROUTINE W REFLEX MICROSCOPIC - Abnormal; Notable for the following:    APPearance TURBID (*)    Hgb urine dipstick TRACE (*)    All other components within normal limits  URINE  MICROSCOPIC-ADD ON - Abnormal; Notable for the following:    Bacteria, UA FEW (*)    All other components within normal limits  CBC WITH DIFFERENTIAL  LIPASE, BLOOD  POC URINE PREG, ED    Imaging Review Ct Abdomen Pelvis Wo Contrast  10/12/2014   CLINICAL DATA:  Evaluate for renal calculi.  Right flank pain.  EXAM: CT ABDOMEN AND PELVIS WITHOUT CONTRAST  TECHNIQUE: Multidetector CT imaging of the abdomen and pelvis was performed following the standard protocol without IV contrast.  COMPARISON:  None   FINDINGS: Lower chest:  No pleural effusion.  The the lung bases are clear.  Hepatobiliary: No liver abnormality.  The gallbladder is normal.  Pancreas: The pancreas is negative.  Spleen: The spleen appears normal.  Adrenals/Urinary Tract: Right adrenal gland adenoma measures 3.2 cm. The left kidney appears normal. No left nephrolithiasis or hydronephrosis. Normal appearance of the right kidney. No right renal calculi identified. No ureteral calculi noted. The urinary bladder appears normal.  Stomach/Bowel: The stomach is within normal limits. The small bowel loops have a normal course and caliber. No obstruction. Normal appearance of the colon. The appendix is visualized and appears normal.  Vascular/Lymphatic: Normal appearance of the abdominal aorta. No enlarged retroperitoneal or mesenteric adenopathy. No enlarged pelvic or inguinal lymph nodes.  Reproductive: The uterus and the adnexal structures appear within normal limits.  Other: Trace free fluid noted within the pelvis. No fluid collections identified.  Musculoskeletal: The visualized osseous structures are on unremarkable. No aggressive lytic or sclerotic bone lesions. No acute findings noted.  IMPRESSION: 1. No evidence for renal calculi or obstructive uropathy. 2. Right adrenal gland adenoma. 3. The appendix is visualized and appears normal.   Electronically Signed   By: Kerby Moors M.D.   On: 10/12/2014 22:57     EKG Interpretation None      MDM   Final diagnoses:  Acute right flank pain    Patient here for evaluation of right flank pain. CT scan obtained to evaluate for renal cocktail I. There is no evidence of obstruction, the patient does have an incidental finding of a treble adenoma. There is no evidence of acute cholecystitis or acute urinary tract infection. PCP follow-up for flank pain as well as findings of adrenal adenoma and need for follow-up regarding this.    Quintella Reichert, MD 10/12/14 [phone]

## 2014-10-13 LAB — POC URINE PREG, ED: PREG TEST UR: NEGATIVE

## 2014-11-15 ENCOUNTER — Other Ambulatory Visit (INDEPENDENT_AMBULATORY_CARE_PROVIDER_SITE_OTHER): Payer: Self-pay | Admitting: General Surgery

## 2014-11-15 DIAGNOSIS — R1011 Right upper quadrant pain: Secondary | ICD-10-CM

## 2014-11-15 NOTE — Addendum Note (Signed)
Addended by: Adin Hector on: 11/15/2014 06:08 PM   Modules accepted: Orders

## 2014-11-16 ENCOUNTER — Other Ambulatory Visit (INDEPENDENT_AMBULATORY_CARE_PROVIDER_SITE_OTHER): Payer: Self-pay | Admitting: *Deleted

## 2014-11-16 DIAGNOSIS — R1011 Right upper quadrant pain: Secondary | ICD-10-CM

## 2014-12-04 ENCOUNTER — Ambulatory Visit
Admission: RE | Admit: 2014-12-04 | Discharge: 2014-12-04 | Disposition: A | Discharge status: Home / Self Care | Payer: BLUE CROSS/BLUE SHIELD | Source: Ambulatory Visit | Attending: General Surgery | Admitting: General Surgery

## 2014-12-19 ENCOUNTER — Other Ambulatory Visit (INDEPENDENT_AMBULATORY_CARE_PROVIDER_SITE_OTHER): Payer: Self-pay | Admitting: General Surgery

## 2014-12-19 DIAGNOSIS — D3501 Benign neoplasm of right adrenal gland: Secondary | ICD-10-CM

## 2014-12-19 NOTE — Addendum Note (Signed)
Addended by: Adin Hector on: 12/19/2014 01:34 PM   Modules accepted: Orders

## 2015-12-13 ENCOUNTER — Emergency Department (HOSPITAL_COMMUNITY)
Admission: EM | Admit: 2015-12-13 | Discharge: 2015-12-13 | Disposition: A | Discharge status: Home / Self Care | Payer: BC Managed Care – PPO | Attending: Emergency Medicine | Admitting: Emergency Medicine

## 2015-12-13 ENCOUNTER — Encounter (HOSPITAL_COMMUNITY): Payer: Self-pay | Admitting: Emergency Medicine

## 2015-12-13 ENCOUNTER — Emergency Department (HOSPITAL_COMMUNITY): Payer: BC Managed Care – PPO

## 2015-12-13 DIAGNOSIS — R079 Chest pain, unspecified: Secondary | ICD-10-CM | POA: Diagnosis not present

## 2015-12-13 DIAGNOSIS — R2241 Localized swelling, mass and lump, right lower limb: Secondary | ICD-10-CM | POA: Diagnosis not present

## 2015-12-13 DIAGNOSIS — F419 Anxiety disorder, unspecified: Secondary | ICD-10-CM | POA: Insufficient documentation

## 2015-12-13 DIAGNOSIS — M79661 Pain in right lower leg: Secondary | ICD-10-CM | POA: Diagnosis not present

## 2015-12-13 DIAGNOSIS — D649 Anemia, unspecified: Secondary | ICD-10-CM | POA: Insufficient documentation

## 2015-12-13 DIAGNOSIS — I10 Essential (primary) hypertension: Secondary | ICD-10-CM | POA: Diagnosis not present

## 2015-12-13 DIAGNOSIS — Z79899 Other long term (current) drug therapy: Secondary | ICD-10-CM | POA: Insufficient documentation

## 2015-12-13 LAB — CBC
HCT: 37.4 % (ref 36.0–46.0)
Hemoglobin: 12.1 g/dL (ref 12.0–15.0)
MCH: 27.7 pg (ref 26.0–34.0)
MCHC: 32.4 g/dL (ref 30.0–36.0)
MCV: 85.6 fL (ref 78.0–100.0)
Platelets: 417 10*3/uL — ABNORMAL HIGH (ref 150–400)
RBC: 4.37 MIL/uL (ref 3.87–5.11)
RDW: 13.4 % (ref 11.5–15.5)
WBC: 7.2 10*3/uL (ref 4.0–10.5)

## 2015-12-13 LAB — I-STAT TROPONIN, ED: Troponin i, poc: 0 ng/mL (ref 0.00–0.08)

## 2015-12-13 LAB — BASIC METABOLIC PANEL
Anion gap: 7 (ref 5–15)
BUN: 13 mg/dL (ref 6–20)
CHLORIDE: 106 mmol/L (ref 101–111)
CO2: 25 mmol/L (ref 22–32)
Calcium: 8.9 mg/dL (ref 8.9–10.3)
Creatinine, Ser: 0.94 mg/dL (ref 0.44–1.00)
GFR calc Af Amer: >60 mL/min (ref >60)
GFR calc non Af Amer: >60 mL/min (ref >60)
Glucose, Bld: 125 mg/dL — ABNORMAL HIGH (ref 65–99)
Potassium: 3.5 mmol/L (ref 3.5–5.1)
SODIUM: 138 mmol/L (ref 135–145)

## 2015-12-13 LAB — D-DIMER, QUANTITATIVE (NOT AT ARMC)

## 2015-12-13 NOTE — ED Notes (Signed)
Patient presents for centralized chest pain radiating through same on back, described as tightness, rates pain 8/10. Denies diaphoresis, N/V, lightheadedness, dizziness. Also c/o right calf pain and concerned for DVT.

## 2015-12-13 NOTE — ED Notes (Signed)
Patient d/c'd self care.  F/U discussed.  Patient verbalized understanding. 

## 2015-12-13 NOTE — ED Provider Notes (Signed)
CSN: [id number]     Arrival date & time 12/13/15  1732 History   First MD Initiated Contact with Patient 12/13/15 1820     Chief Complaint  Patient presents with  . Chest Pain  . Leg Pain   HPI   Rhonda Young is a 45 y.o. female PMH significant for hypertension, anxiety presenting with a 3 year history of chest pain and a 1 day history of right calf pain. She states her right calf pain began without precipitating factors, and she endorses right leg swelling. She states she soaked her leg and up some salt that night and rubbed alcohol on her calf muscle and this took the pain away. The next night her left calf started to hurt.  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 She states she has had negative workups for chest pain in the past. During the interview she began crying and stated that her son had been killed by a drunk driver. She thinks that her chest pain may be attributed to her anxiety, which she has seen a therapist in the past but she is been unable to continue her appointments due to finances. She denies fevers, chills, shortness of breath, abdominal pain, nausea, vomiting, change in bowel or bladder habits. She denies recent surgeries, history of DVT or blood clots, cancer history. She states that she is waiting on a biopsy result from an adrenal tumor.  Past Medical History  Diagnosis Date  . Hypertension   . Anxiety   . Anemia    History reviewed. No pertinent past surgical history. No family history on file. Social History  Substance Use Topics  . Smoking status: Never Smoker   . Smokeless tobacco: None  . Alcohol Use: No   OB History    No data available     Review of  Systems  Ten systems are reviewed and are negative for acute change except as noted in the HPI  Allergies  Review of patient's allergies indicates no known allergies.  Home Medications   Prior to Admission medications   Medication Sig Start Date End Date Taking? Authorizing Provider  Aspirin-Salicylamide-Caffeine (BC HEADACHE POWDER PO) Take 1 packet by mouth daily as needed (headache & pain). For pain.    Historical Provider, MD  bisoprolol (ZEBETA) 10 MG tablet Take 10 mg by mouth daily.    Historical Provider, MD  FeFum-FePoly-FA-B Cmp-C-Biot (FOLIVANE-PLUS PO) Take 1 capsule by mouth daily.    Historical Provider, MD  hydrochlorothiazide (HYDRODIURIL) 25 MG tablet Take 25 mg by mouth daily.    Historical Provider, MD  HYDROcodone-acetaminophen (NORCO/VICODIN) 5-325 MG per tablet Take 1 tablet by mouth every 6 (six) hours as needed. 10/12/14   Quintella Reichert, MD  Multiple Vitamins-Minerals (MULTIVITAMIN GUMMIES ADULT) CHEW Chew 1 tablet by mouth daily.    Historical Provider, MD  pantoprazole (PROTONIX) 20 MG tablet Take 1 tablet (20 mg total) by mouth daily. 12/09/11 12/08/12  Milton Ferguson, MD  Vitamin D, Ergocalciferol, (DRISDOL) 50000 UNITS CAPS capsule Take 50,000 Units by mouth every 7 (seven) days. Take Every Monday.    Historical Provider, MD   BP 121/95 mmHg  Pulse 65  Temp(Src) 98.7 F (37.1 C) (Oral)  Resp 12  SpO2 100%  LMP 12/01/2015 Physical Exam  Constitutional: She appears well-developed and well-nourished. No distress.  HENT:  Head: Normocephalic and atraumatic.  Mouth/Throat: Oropharynx is clear and moist. No oropharyngeal exudate.  Eyes: Conjunctivae are normal. Pupils are equal, round, and reactive to light. Right eye exhibits no discharge. Left eye exhibits no discharge. No scleral icterus.  Neck: No tracheal deviation present.  Cardiovascular: Normal rate, regular rhythm, normal heart sounds and intact distal pulses.  Exam reveals no gallop and no friction  rub.   No murmur heard. Pulmonary/Chest: Effort normal and breath sounds normal. No respiratory distress. She has no wheezes. She has no rales. She exhibits no tenderness.  Abdominal: Soft. Bowel sounds are normal. She exhibits no distension and no mass. There is no tenderness. There is no rebound and no guarding.  Musculoskeletal: Normal range of motion. She exhibits no edema or tenderness.  No calf tenderness, negative Homan's sign.  Lymphadenopathy:    She has no cervical adenopathy.  Neurological: She is alert. Coordination normal.  Skin: Skin is warm and dry. No rash noted. She is not diaphoretic. No erythema.  Psychiatric:  Anxious and tearful  Nursing note and vitals reviewed.   ED Course  Procedures  Labs Review Labs Reviewed  BASIC METABOLIC PANEL - Abnormal; Notable for the following:    Glucose, Bld 125 (*)    All other components within normal limits  CBC - Abnormal; Notable for the following:    Platelets 417 (*)    All other components within normal limits  D-DIMER, QUANTITATIVE (NOT AT [facility])  Randolm Idol, ED   Imaging Review Dg Chest 2 View  12/13/2015  CLINICAL DATA:  Mid chest pain for 1 week EXAM: CHEST  2 VIEW COMPARISON:  12/09/2011 FINDINGS: Artifact from bilateral EKG leads. Normal heart size and mediastinal contours. No acute infiltrate or edema. No effusion or pneumothorax. No acute osseous findings. IMPRESSION: Negative chest. Electronically Signed   By: Monte Fantasia M.D.   On: 12/13/2015 18:19   I have personally reviewed and evaluated these images and lab results as part of my medical decision-making.   EKG Interpretation   Date/Time:  Thursday December 13 2015 17:46:35 EST Ventricular Rate:  82 PR Interval:  164 QRS Duration: 75 QT Interval:  387 QTC Calculation: 452 R Axis:   15 Text Interpretation:  Sinus rhythm Low voltage, precordial leads No  significant change since last tracing Confirmed by KNAPP  MD-J, JON  ([id number]) on 12/14/2015  12:18:52 AM Also confirmed by KNAPP  MD-J, JON  [id number]), editor [place]  CCT, BEVERLY ([id number])  on 12/14/2015 7:23:33 AM      MDM   Final diagnoses:  Chest pain, unspecified chest pain type   Patient nontoxic appearing, VSS.  Patient is to be discharged with recommendation to follow up with PCP in regards to today's hospital visit. Chest pain is not likely of cardiac or pulmonary etiology d/t presentation. D-dimer negative, VSS, no tracheal deviation, no JVD or new murmur, RRR, breath sounds equal bilaterally, EKG without acute abnormalities, negative troponin, and negative CXR. Pt has been advised to return to the ED if CP becomes exertional, associated with diaphoresis or nausea, radiates to left jaw/arm, worsens or becomes concerning in any way. Pt appears reliable for follow up and is agreeable to discharge.  Case has been discussed with Dr. Tomi Bamberger who agrees with the above plan to discharge.     Lions, PA-C 12/14/15 2253  Dorie Rank, MD 12/15/15 [phone]

## 2015-12-13 NOTE — Discharge Instructions (Signed)
Ms. Rhonda Young,  Nice meeting you! Please follow-up with your primary care provider within one week and cardiology (as needed). Return to the emergency department if you develop increased chest pain, jaw pain, shortness of breath. Feel better soon!  S. Wendie Simmer, PA-C

## 2015-12-17 NOTE — Progress Notes (Signed)
Cardiology Office Note:    Date:  12/18/2015   ID:  Rhonda Young, Rhonda Young 11-May-1971, MRN [id number]  PCP:   Melinda Crutch, MD  Cardiologist:  New - Dr. Daneen Schick   Electrophysiologist:  N/a  Referring MD - Dr. Dorie Rank  Chief Complaint  Patient presents with  . Hospitalization Follow-up    Seen in ED 12/13/15; Consult for chest pain    History of Present Illness:     Rhonda Young is a 45 y.o. female with a hx of HTN.  She was seen in the emergency room 12/13/15 with chest discomfort. ECG was normal. Troponin was normal. D-dimer was negative. Chest x-ray was normal. She was discharged with plans for outpatient follow-up.  She has had substernal chest tightness that comes and goes for 3 years. It is no worse.  She denies exertional symptoms.  Her CP does radiate into her back between her shoulder blades.  She denies assoc nausea, dyspnea.  She denies syncope. She denies orthopnea, PND, edema.  She notes some leg pain, bilaterally.  This occurred the night she went to the ED. She had recently been seen in urgent care and was given Albuterol inhaler for her chest pain.  She noted a non-productive cough.  She felt like her leg pain was exacerbated by the Albuterol.  She denies symptoms consistent with claudication.    Past Medical History  Diagnosis Date  . Hypertension   . Anxiety   . Iron deficiency anemia     2/2 menstrual cycles  . Menorrhagia   . Borderline diabetes     diet controlled  . Adrenal adenoma     R side; Gets serial [place] yearly    Past Surgical History  Procedure Laterality Date  . Nasal sinus surgery  2013  . Bilateral tubal ligation  2006    Current Medications: Outpatient Prescriptions Prior to Visit  Medication Sig Dispense Refill  . Aspirin-Salicylamide-Caffeine (BC HEADACHE POWDER PO) Take 1 packet by mouth daily as needed (headache & pain). For pain.    . bisoprolol (ZEBETA) 10 MG tablet Take 10 mg by mouth daily.    . cholecalciferol (VITAMIN D) 1000  units tablet Take 2,000 Units by mouth daily.    . Multiple Vitamins-Minerals (MULTIVITAMIN GUMMIES ADULT) CHEW Chew 1 tablet by mouth at bedtime.     . phentermine 37.5 MG capsule Take 37.5 mg by mouth every morning.    Marland Kitchen PROAIR HFA 108 (90 Base) MCG/ACT inhaler Inhale 2 puffs into the lungs every 4 (four) hours as needed for wheezing or shortness of breath.   3  . FeFum-FePoly-FA-B Cmp-C-Biot (FOLIVANE-PLUS PO) Take 1 capsule by mouth daily. Reported on 12/18/2015    . pantoprazole (PROTONIX) 20 MG tablet Take 1 tablet (20 mg total) by mouth daily. 30 tablet 1   No facility-administered medications prior to visit.     Allergies:   Review of patient's allergies indicates no known allergies.   Social History   Social History  . Marital Status: Single    Spouse Name: N/A  . Number of Children: N/A  . Years of Education: N/A   Occupational History  . Administrative Support [employer]   Social History Main Topics  . Smoking status: Never Smoker   . Smokeless tobacco: None  . Alcohol Use: No  . Drug Use: No  . Sexual Activity: Not Asked   Other Topics Concern  . None   Social History Narrative   Works at [employer]   Single   3 children (1 child deceased - killed by drunk driver)   Originally from [place] - moved to [place] [address]s     Family History:  The patient's family history includes Hemachromatosis in her mother; Hypertension in her father and mother. There is no history of Heart attack.   ROS:   Please see the history of present illness.    Review of Systems  Musculoskeletal: Positive for back pain and joint pain.  Gastrointestinal: Positive for constipation.  All other systems reviewed and are negative.   Physical Exam:    VS:  BP 124/100 mmHg  Pulse 82  Ht 5\' 2"  (1.575 m)  Wt 212 lb 3.2 oz (96.253 kg)  BMI 38.80 kg/m2  LMP 12/01/2015   GEN: Well nourished, well developed, in no acute distress HEENT: normal Neck: no JVD, no masses Cardiac: Normal  S1/S2, RRR; no murmurs, rubs, or gallops, no edema;  no carotid bruits,  DP/PT 2+ bilaterally Respiratory:  clear to auscultation bilaterally; no wheezing, rhonchi or rales GI: soft, nontender, nondistended, + BS MS: no deformity or atrophy Skin: warm and dry  Neuro:  Bilateral strength equal, no focal deficits  Psych: Alert and oriented x 3, normal affect  Wt Readings from Last 3 Encounters:  12/18/15 212 lb 3.2 oz (96.253 kg)      Studies/Labs Reviewed:     EKG:  EKG is  ordered today.  The ekg ordered today demonstrates NSR, HR 82, normal axis, QTc 479 ms  Recent Labs: 12/13/2015: BUN 13; Creatinine, Ser 0.94; Hemoglobin 12.1; Platelets 417*; Potassium 3.5; Sodium 138   Recent Lipid Panel No results found for: CHOL, TRIG, HDL, CHOLHDL, VLDL, LDLCALC, LDLDIRECT  Additional studies/ records that were reviewed today include:   None   ASSESSMENT:     1. Other chest pain   2. Essential hypertension   3. Pain of lower extremity, unspecified laterality     PLAN:     In order of problems listed above:  1. Chest pain - Atypical.  She has risk factors or HTN and borderline DM.  She has not had any ischemic testing in the past.   -  Arrange plain exercise treadmill test.  -  FU with Dr. Daneen Schick prn.  2. HTN - BP uncontrolled.  She has not taken any medications yet today.  She has been taken off of HCTZ by her PCP in the past. I have asked her to monitor her BP over 2 weeks and review her BPs with her PCP.  3. Leg pain - Distal pulses are good. Symptoms are not c/w claudication. FU with PCP.    Medication Adjustments/Labs and Tests Ordered: Current medicines are reviewed at length with the patient today.  Concerns regarding medicines are outlined above.  Medication changes, Labs and Tests ordered today are outlined in the Patient Instructions noted below. Patient Instructions  Medication Instructions:  Your physician recommends that you continue on your current  medications as directed. Please refer to the Current Medication list given to you today.  Labwork: NONE  Testing/Procedures: Your physician has requested that you have an exercise tolerance test. For further information please visit [email or web address]. Please also follow instruction sheet, as given.  Follow-Up: Dr. Daneen Schick as needed.  Any Other Special Instructions Will Be Listed Below (If Applicable). Check your blood pressure daily for 1-2 weeks and follow up with your primary care doctor.  If you need a refill on your cardiac medications before  your next appointment, please call your pharmacy.   Signed, Richardson Dopp, PA-C  12/18/2015 9:13 AM    [facility] [address], [place], [place]  [address] Phone: [phone]; Fax: [phone]

## 2015-12-18 ENCOUNTER — Ambulatory Visit (INDEPENDENT_AMBULATORY_CARE_PROVIDER_SITE_OTHER): Payer: BC Managed Care – PPO | Admitting: Physician Assistant

## 2015-12-18 ENCOUNTER — Encounter: Payer: Self-pay | Admitting: Physician Assistant

## 2015-12-18 VITALS — BP 124/100 | HR 82 | Ht 62.0 in | Wt 212.2 lb

## 2015-12-18 DIAGNOSIS — R079 Chest pain, unspecified: Secondary | ICD-10-CM

## 2015-12-18 DIAGNOSIS — M79606 Pain in leg, unspecified: Secondary | ICD-10-CM | POA: Diagnosis not present

## 2015-12-18 DIAGNOSIS — I10 Essential (primary) hypertension: Secondary | ICD-10-CM

## 2015-12-18 DIAGNOSIS — R0789 Other chest pain: Secondary | ICD-10-CM | POA: Diagnosis not present

## 2015-12-18 NOTE — Patient Instructions (Addendum)
Medication Instructions:  Your physician recommends that you continue on your current medications as directed. Please refer to the Current Medication list given to you today.  Labwork: NONE  Testing/Procedures: Your physician has requested that you have an exercise tolerance test. For further information please visit [email or web address]. Please also follow instruction sheet, as given.  Follow-Up: Dr. Daneen Schick as needed.  Any Other Special Instructions Will Be Listed Below (If Applicable). Check your blood pressure daily for 1-2 weeks and follow up with your primary care doctor.  If you need a refill on your cardiac medications before your next appointment, please call your pharmacy.

## 2015-12-25 ENCOUNTER — Other Ambulatory Visit: Payer: Self-pay | Admitting: General Surgery

## 2015-12-25 DIAGNOSIS — D3501 Benign neoplasm of right adrenal gland: Secondary | ICD-10-CM

## 2016-01-01 ENCOUNTER — Encounter: Payer: Self-pay | Admitting: Physician Assistant

## 2016-01-01 ENCOUNTER — Telehealth: Payer: Self-pay | Admitting: *Deleted

## 2016-01-01 ENCOUNTER — Ambulatory Visit (INDEPENDENT_AMBULATORY_CARE_PROVIDER_SITE_OTHER): Payer: BC Managed Care – PPO

## 2016-01-01 DIAGNOSIS — R0789 Other chest pain: Secondary | ICD-10-CM

## 2016-01-01 LAB — EXERCISE TOLERANCE TEST
CHL CUP MPHR: 178 {beats}/min
CHL CUP RESTING HR STRESS: 82 {beats}/min
CHL CUP STRESS STAGE 1 GRADE: 0 %
CHL CUP STRESS STAGE 1 HR: 86 {beats}/min
CHL CUP STRESS STAGE 1 SPEED: 0 mph
CHL CUP STRESS STAGE 2 GRADE: 0 %
CHL CUP STRESS STAGE 2 SPEED: 1 mph
CHL CUP STRESS STAGE 4 GRADE: 10 %
CHL CUP STRESS STAGE 4 SPEED: 1.7 mph
CHL CUP STRESS STAGE 7 HR: 151 {beats}/min
CHL CUP STRESS STAGE 8 HR: 110 {beats}/min
CSEPED: 8 min
CSEPEDS: 0 s
CSEPHR: 92 %
Estimated workload: 10.1 METS
Peak HR: 162 {beats}/min
Percent of predicted max HR: 91 %
RPE: 15
Stage 1 DBP: 91 mmHg
Stage 1 SBP: 141 mmHg
Stage 2 HR: 93 {beats}/min
Stage 3 Grade: 0 %
Stage 3 HR: 93 {beats}/min
Stage 3 Speed: 1 mph
Stage 4 HR: 131 {beats}/min
Stage 5 Grade: 12 %
Stage 5 HR: 153 {beats}/min
Stage 5 Speed: 2.5 mph
Stage 6 Grade: 14 %
Stage 6 HR: 162 {beats}/min
Stage 6 Speed: 3.4 mph
Stage 7 DBP: 81 mmHg
Stage 7 Grade: 0 %
Stage 7 SBP: 148 mmHg
Stage 7 Speed: 0 mph
Stage 8 DBP: 82 mmHg
Stage 8 Grade: 0 %
Stage 8 SBP: 126 mmHg
Stage 8 Speed: 0 mph

## 2016-01-01 NOTE — Telephone Encounter (Signed)
Pt has been notified of normal ETT with verbal understanding

## 2016-01-02 ENCOUNTER — Other Ambulatory Visit: Payer: Self-pay | Admitting: General Surgery

## 2016-01-02 ENCOUNTER — Ambulatory Visit
Admission: RE | Admit: 2016-01-02 | Discharge: 2016-01-02 | Disposition: A | Discharge status: Home / Self Care | Payer: BC Managed Care – PPO | Source: Ambulatory Visit | Attending: General Surgery | Admitting: General Surgery

## 2016-01-02 DIAGNOSIS — D3501 Benign neoplasm of right adrenal gland: Secondary | ICD-10-CM

## 2016-05-05 ENCOUNTER — Encounter (HOSPITAL_COMMUNITY): Payer: Self-pay | Admitting: Emergency Medicine

## 2016-05-05 ENCOUNTER — Emergency Department (HOSPITAL_COMMUNITY)
Admission: EM | Admit: 2016-05-05 | Discharge: 2016-05-05 | Disposition: A | Discharge status: Home / Self Care | Payer: BC Managed Care – PPO | Attending: Emergency Medicine | Admitting: Emergency Medicine

## 2016-05-05 DIAGNOSIS — Z7982 Long term (current) use of aspirin: Secondary | ICD-10-CM | POA: Insufficient documentation

## 2016-05-05 DIAGNOSIS — I10 Essential (primary) hypertension: Secondary | ICD-10-CM | POA: Diagnosis not present

## 2016-05-05 DIAGNOSIS — K279 Peptic ulcer, site unspecified, unspecified as acute or chronic, without hemorrhage or perforation: Secondary | ICD-10-CM | POA: Insufficient documentation

## 2016-05-05 DIAGNOSIS — Z79899 Other long term (current) drug therapy: Secondary | ICD-10-CM | POA: Insufficient documentation

## 2016-05-05 DIAGNOSIS — R1030 Lower abdominal pain, unspecified: Secondary | ICD-10-CM | POA: Diagnosis present

## 2016-05-05 LAB — URINALYSIS, ROUTINE W REFLEX MICROSCOPIC
Bilirubin Urine: NEGATIVE
Glucose, UA: NEGATIVE mg/dL
Ketones, ur: NEGATIVE mg/dL
Leukocytes, UA: NEGATIVE
Nitrite: NEGATIVE
PROTEIN: NEGATIVE mg/dL
Specific Gravity, Urine: 1.023 (ref 1.005–1.030)
pH: 6 (ref 5.0–8.0)

## 2016-05-05 LAB — CBC
HEMATOCRIT: 38 % (ref 36.0–46.0)
HEMOGLOBIN: 12.4 g/dL (ref 12.0–15.0)
MCH: 27.1 pg (ref 26.0–34.0)
MCHC: 32.6 g/dL (ref 30.0–36.0)
MCV: 83 fL (ref 78.0–100.0)
Platelets: 430 10*3/uL — ABNORMAL HIGH (ref 150–400)
RBC: 4.58 MIL/uL (ref 3.87–5.11)
RDW: 13.3 % (ref 11.5–15.5)
WBC: 8.8 10*3/uL (ref 4.0–10.5)

## 2016-05-05 LAB — COMPREHENSIVE METABOLIC PANEL
ALBUMIN: 4.1 g/dL (ref 3.5–5.0)
ALT: 18 U/L (ref 14–54)
ANION GAP: 8 (ref 5–15)
AST: 20 U/L (ref 15–41)
Alkaline Phosphatase: 118 U/L (ref 38–126)
BILIRUBIN TOTAL: 0.5 mg/dL (ref 0.3–1.2)
BUN: 11 mg/dL (ref 6–20)
CHLORIDE: 104 mmol/L (ref 101–111)
CO2: 26 mmol/L (ref 22–32)
Calcium: 9.1 mg/dL (ref 8.9–10.3)
Creatinine, Ser: 0.86 mg/dL (ref 0.44–1.00)
GFR calc Af Amer: >60 mL/min (ref >60)
GFR calc non Af Amer: >60 mL/min (ref >60)
GLUCOSE: 90 mg/dL (ref 65–99)
POTASSIUM: 3.6 mmol/L (ref 3.5–5.1)
SODIUM: 138 mmol/L (ref 135–145)
Total Protein: 7.9 g/dL (ref 6.5–8.1)

## 2016-05-05 LAB — URINE MICROSCOPIC-ADD ON
BACTERIA UA: NONE SEEN
WBC UA: NONE SEEN WBC/hpf (ref 0–5)

## 2016-05-05 LAB — LIPASE, BLOOD: LIPASE: 25 U/L (ref 11–51)

## 2016-05-05 MED ORDER — SUCRALFATE 1 GM/10ML PO SUSP: 1.0000 g | Freq: Three times a day (TID) | ORAL | Status: DC

## 2016-05-05 MED ORDER — PANTOPRAZOLE SODIUM 40 MG PO TBEC
40.0000 mg | DELAYED_RELEASE_TABLET | Freq: Once | ORAL | Status: AC
  Administered 2016-05-05: 40 mg via ORAL
  Filled 2016-05-05: qty 1

## 2016-05-05 MED ORDER — ONDANSETRON HCL 4 MG PO TABS: 4.0000 mg | ORAL_TABLET | Freq: Four times a day (QID) | ORAL | Status: DC

## 2016-05-05 MED ORDER — GI COCKTAIL ~~LOC~~
30.0000 mL | Freq: Once | ORAL | Status: AC
  Administered 2016-05-05: 30 mL via ORAL
  Filled 2016-05-05: qty 30

## 2016-05-05 MED ORDER — ONDANSETRON 4 MG PO TBDP
4.0000 mg | ORAL_TABLET | Freq: Once | ORAL | Status: AC
  Administered 2016-05-05: 4 mg via ORAL
  Filled 2016-05-05: qty 1

## 2016-05-05 NOTE — Discharge Instructions (Signed)
Peptic Ulcer ° °A peptic ulcer is a sore in the lining of your esophagus (esophageal ulcer), stomach (gastric ulcer), or in the first part of your small intestine (duodenal ulcer). The ulcer causes erosion into the deeper tissue. °CAUSES  °Normally, the lining of the stomach and the small intestine protects itself from the acid that digests food. The protective lining can be damaged by: °· An infection caused by a bacterium called Helicobacter pylori (H. pylori). °· Regular use of nonsteroidal anti-inflammatory drugs (NSAIDs), such as ibuprofen or aspirin. °· Smoking tobacco. °Other risk factors include being older than 50, drinking alcohol excessively, and having a family history of ulcer disease.  °SYMPTOMS  °· Burning pain or gnawing in the area between the chest and the belly button. °· Heartburn. °· Nausea and vomiting. °· Bloating. °The pain can be worse on an empty stomach and at night. If the ulcer results in bleeding, it can cause: °· Black, tarry stools. °· Vomiting of bright red blood. °· Vomiting of coffee-ground-looking materials. °DIAGNOSIS  °A diagnosis is usually made based upon your history and an exam. Other tests and procedures may be performed to find the cause of the ulcer. Finding a cause will help determine the best treatment. Tests and procedures may include: °· Blood tests, stool tests, or breath tests to check for the bacterium H. pylori. °· An upper gastrointestinal (GI) series of the esophagus, stomach, and small intestine. °· An endoscopy to examine the esophagus, stomach, and small intestine. °· A biopsy. °TREATMENT  °Treatment may include: °· Eliminating the cause of the ulcer, such as smoking, NSAIDs, or alcohol. °· Medicines to reduce the amount of acid in your digestive tract. °· Antibiotic medicines if the ulcer is caused by the H. pylori bacterium. °· An upper endoscopy to treat a bleeding ulcer. °· Surgery if the bleeding is severe or if the ulcer created a hole somewhere in the  digestive system. °HOME CARE INSTRUCTIONS  °· Avoid tobacco, alcohol, and caffeine. Smoking can increase the acid in the stomach, and continued smoking will impair the healing of ulcers. °· Avoid foods and drinks that seem to cause discomfort or aggravate your ulcer. °· Only take medicines as directed by your caregiver. Do not substitute over-the-counter medicines for prescription medicines without talking to your caregiver. °· Keep any follow-up appointments and tests as directed. °SEEK MEDICAL CARE IF:  °· Your do not improve within 7 days of starting treatment. °· You have ongoing indigestion or heartburn. °SEEK IMMEDIATE MEDICAL CARE IF:  °· You have sudden, sharp, or persistent abdominal pain. °· You have bloody or dark black, tarry stools. °· You vomit blood or vomit that looks like coffee grounds. °· You become light-headed, weak, or feel faint. °· You become sweaty or clammy. °MAKE SURE YOU:  °· Understand these instructions. °· Will watch your condition. °· Will get help right away if you are not doing well or get worse. °  °This information is not intended to replace advice given to you by your health care provider. Make sure you discuss any questions you have with your health care provider. °  °Document Released: 10/10/2000 Document Revised: 11/03/2014 Document Reviewed: 05/12/2012 °Elsevier Interactive Patient Education ©2016 Elsevier Inc. ° °

## 2016-05-05 NOTE — ED Provider Notes (Signed)
CSN: [id number]     Arrival date & time 05/05/16  1729 History  By signing my name below, I, Evelene Croon, attest that this documentation has been prepared under the direction and in the presence of non-physician practitioner, Linus Mako, PA-C. Electronically Signed: Evelene Croon, Scribe. 05/05/2016. 9:34 PM.    Chief Complaint  Patient presents with  . Abdominal Pain    The history is provided by the patient. No language interpreter was used.     HPI Comments:  Rhonda Young is a 45 y.o. female who presents to the Emergency Department complaining of intermittent, upper and lower abdominal pain x 1 week.  She rates her pain a 6/10 at this time. She reports associated nausea. Pt was evaluated at Urgent care 3 days ago for the same and was given Zantac which she has been taking without relief. She reports 1 episodes of self-induced vomiting which provided mild relief of her pain. Two weeks ago while on vacation in [place] pt went to spike a ball while playing volleyball and felt a pain in her abdomen but denies any other injury. She  Also denies diarrhea, fever, urinary symptoms and abnormal vaginal bleeding. No alleviating factors noted. Pt is currently being treated for BV. She states she uses BC powder daily.   Past Medical History  Diagnosis Date  . Hypertension   . Anxiety   . Iron deficiency anemia     2/2 menstrual cycles  . Menorrhagia   . Borderline diabetes     diet controlled  . Adrenal adenoma     R side; Gets serial [place] yearly  . H/O exercise stress test     ETT 3/17: No ST changes   Past Surgical History  Procedure Laterality Date  . Nasal sinus surgery  2013  . Bilateral tubal ligation  2006   Family History  Problem Relation Age of Onset  . Hypertension Mother   . Hemachromatosis Mother   . Hypertension Father   . Heart attack Neg Hx    Social History  Substance Use Topics  . Smoking status: Never Smoker   . Smokeless tobacco: None  . Alcohol Use:  No   OB History    No data available     Review of Systems  Gastrointestinal: Positive for vomiting (self-induced) and abdominal pain.  Genitourinary: Negative for dysuria, hematuria and vaginal bleeding.  All other systems reviewed and are negative.  Allergies  Review of patient's allergies indicates no known allergies.  Home Medications   Prior to Admission medications   Medication Sig Start Date End Date Taking? Authorizing Provider  amitriptyline (ELAVIL) 25 MG tablet Take 25 mg by mouth at bedtime.  12/11/15  Yes Historical Provider, MD  Aspirin-Salicylamide-Caffeine (BC HEADACHE POWDER PO) Take 1 packet by mouth daily as needed (headache & pain). For pain.   Yes Historical Provider, MD  bisoprolol (ZEBETA) 10 MG tablet Take 10 mg by mouth daily.   Yes Historical Provider, MD  cholecalciferol (VITAMIN D) 1000 units tablet Take 2,000 Units by mouth daily.   Yes Historical Provider, MD  FeFum-FePoly-FA-B Cmp-C-Biot (INTEGRA PLUS) CAPS Take 1 capsule by mouth daily. 11/27/15  Yes Historical Provider, MD  Multiple Vitamins-Minerals (MULTIVITAMIN GUMMIES ADULT) CHEW Chew 1 tablet by mouth at bedtime.    Yes Historical Provider, MD  pantoprazole (PROTONIX) 20 MG tablet Take 20 mg by mouth daily.   Yes Historical Provider, MD  phentermine 37.5 MG capsule Take 37.5 mg by mouth every morning.  Yes Historical Provider, MD  PROAIR HFA 108 [phone] Base) MCG/ACT inhaler Inhale 2 puffs into the lungs every 4 (four) hours as needed for wheezing or shortness of breath.  12/06/15  Yes Historical Provider, MD  Probiotic Product (PROBIOTIC & ACIDOPHILUS EX ST PO) Take 2 capsules by mouth daily.    Yes Historical Provider, MD  ondansetron (ZOFRAN) 4 MG tablet Take 1 tablet (4 mg total) by mouth every 6 (six) hours. 05/05/16   Aniket Paye Carlota Raspberry, PA-C  sucralfate (CARAFATE) 1 GM/10ML suspension Take 10 mLs (1 g total) by mouth 4 (four) times daily -  with meals and at bedtime. 05/05/16   Wynn Kernes Carlota Raspberry, PA-C   BP  131/97 mmHg  Pulse 110  Temp(Src) 98.9 F (37.2 C) (Oral)  Ht 5\' 2"  (1.575 m)  Wt 99.791 kg  BMI 40.23 kg/m2  SpO2 97% Physical Exam  Constitutional: She is oriented to person, place, and time. She appears well-developed and well-nourished. No distress.  HENT:  Head: Normocephalic and atraumatic.  Eyes: Conjunctivae are normal.  Cardiovascular: Normal rate.   Pulmonary/Chest: Effort normal.  Abdominal: Bowel sounds are normal. She exhibits no distension. There is tenderness in the epigastric area. There is no rigidity, no rebound, no guarding and no CVA tenderness.  Genitourinary:  Pt declined exam- doesn't feel like her symptoms are related  Neurological: She is alert and oriented to person, place, and time.  Skin: Skin is warm and dry.  Psychiatric: She has a normal mood and affect.  Nursing note and vitals reviewed.   ED Course  Procedures   DIAGNOSTIC STUDIES:  Oxygen Saturation is 97% on RA, normal by my interpretation.    COORDINATION OF CARE:  9:27 PM Pt updated with partial results. Will order GI cocktail and Zofran. Discussed treatment plan with pt at bedside and pt agreed to plan.  Labs Review Labs Reviewed  CBC - Abnormal; Notable for the following:    Platelets 430 (*)    All other components within normal limits  URINALYSIS, ROUTINE W REFLEX MICROSCOPIC (NOT AT [facility]) - Abnormal; Notable for the following:    Hgb urine dipstick TRACE (*)    All other components within normal limits  URINE MICROSCOPIC-ADD ON - Abnormal; Notable for the following:    Squamous Epithelial / LPF 0-5 (*)    All other components within normal limits  LIPASE, BLOOD  COMPREHENSIVE METABOLIC PANEL    Imaging Review No results found. I have personally reviewed and evaluated these images and lab results as part of my medical decision-making.   EKG Interpretation None      MDM   Final diagnoses:  Peptic ulcer    Pt with epigastric pain, had abd [place] in 2016 showed no  gallbladder stones and CT in 12/2015 which also showed no gallstones. She was given a GI cocktail and protonix, reports complete relief of her symptoms. She is already on Zantac. Her lipase, CBC, CMP and UA are unremarkable. Pt to follow-up with PCP and given return to ED precautions. She is requesting to leave because she has work in the AM.  Rx: Carafate and Zofran  Blood pressure 131/97, pulse 110, temperature 98.9 F (37.2 C), temperature source Oral, height 5\' 2"  (1.575 m), weight 99.791 kg, SpO2 97 %.   Delos Haring, PA-C 05/05/16 [place], MD 05/07/16 [phone]

## 2016-05-05 NOTE — ED Notes (Signed)
Pt c/o upper and lower abdominal pain x 1 week. Pt sts "I take BC Powder all the time and I'm worried I might have an ulcer." Pt sts pain makes her nauseous. Denies vomiting. Denies diarrhea. Denies urinary symptoms. A&Ox4 and ambulatory.

## 2018-07-22 ENCOUNTER — Other Ambulatory Visit: Payer: Self-pay | Admitting: *Deleted

## 2018-07-22 DIAGNOSIS — R1010 Upper abdominal pain, unspecified: Secondary | ICD-10-CM

## 2018-08-05 ENCOUNTER — Ambulatory Visit
Admission: RE | Admit: 2018-08-05 | Discharge: 2018-08-05 | Disposition: A | Discharge status: Home / Self Care | Payer: BC Managed Care – PPO | Source: Ambulatory Visit | Attending: *Deleted | Admitting: *Deleted

## 2018-08-05 DIAGNOSIS — R1010 Upper abdominal pain, unspecified: Secondary | ICD-10-CM

## 2018-08-17 ENCOUNTER — Encounter: Payer: Self-pay | Admitting: Gastroenterology

## 2018-09-03 ENCOUNTER — Other Ambulatory Visit (INDEPENDENT_AMBULATORY_CARE_PROVIDER_SITE_OTHER): Payer: BC Managed Care – PPO

## 2018-09-03 ENCOUNTER — Encounter: Payer: Self-pay | Admitting: Gastroenterology

## 2018-09-03 ENCOUNTER — Ambulatory Visit (INDEPENDENT_AMBULATORY_CARE_PROVIDER_SITE_OTHER): Payer: BC Managed Care – PPO | Admitting: Gastroenterology

## 2018-09-03 VITALS — BP 130/72 | HR 82 | Ht 63.0 in | Wt 231.0 lb

## 2018-09-03 DIAGNOSIS — R1012 Left upper quadrant pain: Secondary | ICD-10-CM | POA: Diagnosis not present

## 2018-09-03 DIAGNOSIS — R1013 Epigastric pain: Secondary | ICD-10-CM | POA: Diagnosis not present

## 2018-09-03 LAB — H. PYLORI ANTIBODY, IGG: H PYLORI IGG: NEGATIVE

## 2018-09-03 MED ORDER — PANTOPRAZOLE SODIUM 40 MG PO TBEC: 40.0000 mg | DELAYED_RELEASE_TABLET | Freq: Every day | ORAL | 6 refills | Status: DC

## 2018-09-03 NOTE — Patient Instructions (Addendum)
-   Find alternatives to stress management. I recommend trying yoga, meditation, deep breathing, exercise.  - Work to maintain a healthy weight - Avoid all NSAIDs - like BC and Naproxen - Pantoprazole 40 mg daily - H pylori blood test today (this is the infection associated with ulcers) - Talk to your doctor for alternative therapies to your headaches - Return to this clinic in 4-8 weeks to make sure that you are feeling better  - Talk to your primary doctor with your blood pressure questions

## 2018-09-03 NOTE — Progress Notes (Signed)
Referring Provider: Lawerance Cruel, MD Primary Care Physician:  Lawerance Cruel, MD   Reason for Consultation: Abdominal pain   IMPRESSION:  LUQ and epigastric abdominal pain x months    - normal ultrasound 08/05/18 Frequent NSAIDs BMI of 41 Psychosocial stress No prior history of colon cancer or polyps  Differential for her abdominal pain is extensive. Given her NSAIDs, PUD is very likely. Must also consider reflux esophagitis and non-erosive reflux disease exacerbated by recent weight gain, H pylori, and gastritis. Ultrasound provides some reassurance and history makes pancreaticobiliary etiologies less likely.  There may be functional overlay.   PLAN: Stress management Work to maintain a healthy weight Avoid all NSAIDs Pantoprazole 40 mg daily H pylori antibody Talk to your doctor for alternative therapies to your headaches Return to clinic in 4-8 weeks to review her symptoms Consider EGD in the future if symptoms are not improved with PPI   HPI: Rhonda Young is a 47 y.o. female in consultation at the request of [name] for further evaluation of abdominal pain.  The history is obtained to the patient and review of her electronic health record. Son killed by a drunk driver in 0347. She has stress and anxiety since that time.   Intermittent abdominal pain for months. Primarily in the LUQ and epigastrium. Occurs every week to every month. Radiates through to the back. Length of symptoms varies. Other providers have told her that it's musculoskeletal pain. Rare nausea. Rare heartburn and brash. No change with eating.  No dysphagia or odynophagia.  No change in bowel habits. Good appetite.  Weight increased over the last year. Pain relieved with BCs. No other associated symptoms. No identified exacerbating or relieving features.  Has not tried any additional therapies or interventions.   She is concerned that her weight and stress may be contributing to the  pain. She feels that her diet is poor do to cost. [facility] for headaches most days. Has chronic constipation.  She was prescribed Naproxen for her headaches but did not like them due to the large pill size.   Mother had similar symptoms. Died 3 weeks after her presentation. She had a history of hemachromatosis and alcohol use. This has her scared and has her worried about finding a specific diagnosis.   I reviewed her abdominal ultrasound from 08/05/2018 obtained to evaluate upper abdominal pain that revealed no gallstones, a normal common bile duct, normal liver, normal visualized pancreas, and normal spleen.  Review of Epic also shows a CT of the abdomen and pelvis without contrast 01/12/2016 to follow-up on a right adrenal adenoma and to evaluate constipation.  This was a negative exam except it did confirm a stable right adrenal adenoma.  No history of endoscopy. She has not previously been seen by a gastroenterologist.   Past Medical History:  Diagnosis Date  . 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 Anxiety   . Borderline diabetes    diet controlled  . H/O exercise stress test    ETT 3/17: No ST changes  . Hypertension   . Iron deficiency anemia    2/2 menstrual cycles  . Menorrhagia     Past Surgical History:  Procedure Laterality Date  . bilateral tubal ligation  2006  . NASAL SINUS SURGERY  2013    Current Outpatient Medications  Medication Sig Dispense Refill  . amitriptyline (ELAVIL) 25 MG tablet Take 25 mg by mouth at bedtime.     Marland Kitchen  Aspirin-Salicylamide-Caffeine (BC HEADACHE POWDER PO) Take 1 packet by mouth daily as needed (headache & pain). For pain.    . bisoprolol (ZEBETA) 10 MG tablet Take 10 mg by mouth daily.    . cholecalciferol (VITAMIN D) 1000 units tablet Take 2,000 Units by mouth daily.    Marland Kitchen FeFum-FePoly-FA-B Cmp-C-Biot (INTEGRA PLUS) CAPS Take 1 capsule by mouth daily.  1  . Multiple Vitamins-Minerals (MULTIVITAMIN GUMMIES ADULT) CHEW Chew 1  tablet by mouth at bedtime.     . ondansetron (ZOFRAN) 4 MG tablet Take 1 tablet (4 mg total) by mouth every 6 (six) hours. 12 tablet 0  . pantoprazole (PROTONIX) 20 MG tablet Take 20 mg by mouth daily.    . phentermine 37.5 MG capsule Take 37.5 mg by mouth every morning.    Marland Kitchen PROAIR HFA 108 (90 Base) MCG/ACT inhaler Inhale 2 puffs into the lungs every 4 (four) hours as needed for wheezing or shortness of breath.   3  . Probiotic Product (PROBIOTIC & ACIDOPHILUS EX ST PO) Take 2 capsules by mouth daily.     . sucralfate (CARAFATE) 1 GM/10ML suspension Take 10 mLs (1 g total) by mouth 4 (four) times daily -  with meals and at bedtime. 420 mL 0   No current facility-administered medications for this visit.     Allergies as of 09/03/2018  . (No Known Allergies)    Family History  Problem Relation Age of Onset  . Hypertension Mother   . Hemachromatosis Mother   . Hypertension Father   . Heart attack Neg Hx     Social History   Socioeconomic History  . Marital status: Single    Spouse name: Not on file  . Number of children: Not on file  . Years of education: Not on file  . Highest education level: Not on file  Occupational History  . Occupation: Teaching laboratory technician: [employer]  Social Needs  . Financial resource strain: Not on file  . Food insecurity:    Worry: Not on file    Inability: Not on file  . Transportation needs:    Medical: Not on file    Non-medical: Not on file  Tobacco Use  . Smoking status: Never Smoker  Substance and Sexual Activity  . Alcohol use: No  . Drug use: No  . Sexual activity: Not on file  Lifestyle  . Physical activity:    Days per week: Not on file    Minutes per session: Not on file  . Stress: Not on file  Relationships  . Social connections:    Talks on phone: Not on file    Gets together: Not on file    Attends religious service: Not on file    Active member of club or organization: Not on file    Attends meetings of  clubs or organizations: Not on file    Relationship status: Not on file  . Intimate partner violence:    Fear of current or ex partner: Not on file    Emotionally abused: Not on file    Physically abused: Not on file    Forced sexual activity: Not on file  Other Topics Concern  . Not on file  Social History Narrative   Works at [employer]   3 children (1 child deceased - killed by drunk driver)   Originally from [place] - moved to [place]: 12 system ROS is  negative except as noted above except for anxiety and allergies.   Physical Exam: Vital signs were reviewed. General:   Alert, well-nourished, pleasant and cooperative in NAD Head:  Normocephalic and atraumatic. Eyes:  Sclera clear, no icterus.   Conjunctiva pink. Mouth:  No deformity or lesions.   Neck:  Supple; no thyromegaly. Lungs:  Clear throughout to auscultation.   No wheezes.  Heart:  Regular rate and rhythm; no murmurs Abdomen:  Soft, nontender, central obesity, normal bowel sounds. Although I cannot reproduce her pain, she localizes it to the epigastric area and LUQ. No rebound or guarding. No hepatosplenomegaly. Rectal:  Deferred  Msk:  Symmetrical without gross deformities. Extremities:  No gross deformities or edema. Neurologic:  Alert and  oriented x4;  grossly nonfocal Skin:  No rash or bruise. Psych:  Alert and cooperative. Normal mood and affect.   Margie Brink L. Tarri Glenn Md, MPH [facility] 09/03/2018, 6:11 AM

## 2018-10-08 ENCOUNTER — Ambulatory Visit: Payer: BC Managed Care – PPO | Admitting: Gastroenterology

## 2018-11-22 ENCOUNTER — Ambulatory Visit: Payer: BC Managed Care – PPO | Admitting: Gastroenterology

## 2018-12-10 ENCOUNTER — Ambulatory Visit: Payer: BC Managed Care – PPO | Admitting: Gastroenterology

## 2019-08-19 ENCOUNTER — Other Ambulatory Visit: Payer: Self-pay

## 2019-08-19 DIAGNOSIS — Z20822 Contact with and (suspected) exposure to covid-19: Secondary | ICD-10-CM

## 2019-08-20 LAB — NOVEL CORONAVIRUS, NAA: SARS-CoV-2, NAA: NOT DETECTED

## 2019-11-25 ENCOUNTER — Ambulatory Visit: Payer: BC Managed Care – PPO | Attending: Internal Medicine

## 2019-11-25 DIAGNOSIS — Z20822 Contact with and (suspected) exposure to covid-19: Secondary | ICD-10-CM

## 2019-11-26 LAB — NOVEL CORONAVIRUS, NAA: SARS-CoV-2, NAA: NOT DETECTED

## 2019-11-30 ENCOUNTER — Ambulatory Visit: Payer: BC Managed Care – PPO | Attending: Internal Medicine

## 2019-11-30 DIAGNOSIS — Z20822 Contact with and (suspected) exposure to covid-19: Secondary | ICD-10-CM

## 2019-12-01 LAB — NOVEL CORONAVIRUS, NAA: SARS-CoV-2, NAA: NOT DETECTED

## 2019-12-30 ENCOUNTER — Other Ambulatory Visit: Payer: Self-pay

## 2019-12-30 ENCOUNTER — Emergency Department (HOSPITAL_COMMUNITY): Payer: BC Managed Care – PPO

## 2019-12-30 ENCOUNTER — Encounter (HOSPITAL_COMMUNITY): Payer: Self-pay

## 2019-12-30 ENCOUNTER — Emergency Department (HOSPITAL_COMMUNITY)
Admission: EM | Admit: 2019-12-30 | Discharge: 2019-12-30 | Disposition: A | Discharge status: Home / Self Care | Payer: BC Managed Care – PPO | Attending: Emergency Medicine | Admitting: Emergency Medicine

## 2019-12-30 DIAGNOSIS — Z20822 Contact with and (suspected) exposure to covid-19: Secondary | ICD-10-CM | POA: Diagnosis not present

## 2019-12-30 DIAGNOSIS — I10 Essential (primary) hypertension: Secondary | ICD-10-CM | POA: Insufficient documentation

## 2019-12-30 DIAGNOSIS — R1011 Right upper quadrant pain: Secondary | ICD-10-CM | POA: Diagnosis not present

## 2019-12-30 DIAGNOSIS — R7989 Other specified abnormal findings of blood chemistry: Secondary | ICD-10-CM | POA: Diagnosis not present

## 2019-12-30 DIAGNOSIS — K8050 Calculus of bile duct without cholangitis or cholecystitis without obstruction: Secondary | ICD-10-CM

## 2019-12-30 DIAGNOSIS — Z79899 Other long term (current) drug therapy: Secondary | ICD-10-CM | POA: Diagnosis not present

## 2019-12-30 DIAGNOSIS — R1031 Right lower quadrant pain: Secondary | ICD-10-CM | POA: Diagnosis present

## 2019-12-30 LAB — CBC
HCT: 37.4 % (ref 36.0–46.0)
Hemoglobin: 11.6 g/dL — ABNORMAL LOW (ref 12.0–15.0)
MCH: 24.7 pg — ABNORMAL LOW (ref 26.0–34.0)
MCHC: 31 g/dL (ref 30.0–36.0)
MCV: 79.6 fL — ABNORMAL LOW (ref 80.0–100.0)
Platelets: 437 10*3/uL — ABNORMAL HIGH (ref 150–400)
RBC: 4.7 MIL/uL (ref 3.87–5.11)
RDW: 15.9 % — ABNORMAL HIGH (ref 11.5–15.5)
WBC: 7.6 10*3/uL (ref 4.0–10.5)
nRBC: 0 % (ref 0.0–0.2)

## 2019-12-30 LAB — COMPREHENSIVE METABOLIC PANEL
ALT: 23 U/L (ref 0–44)
AST: 24 U/L (ref 15–41)
Albumin: 3.9 g/dL (ref 3.5–5.0)
Alkaline Phosphatase: 107 U/L (ref 38–126)
Anion gap: 9 (ref 5–15)
BUN: 17 mg/dL (ref 6–20)
CO2: 24 mmol/L (ref 22–32)
Calcium: 9.2 mg/dL (ref 8.9–10.3)
Chloride: 105 mmol/L (ref 98–111)
Creatinine, Ser: 1.03 mg/dL — ABNORMAL HIGH (ref 0.44–1.00)
GFR calc Af Amer: >60 mL/min (ref >60)
GFR calc non Af Amer: >60 mL/min (ref >60)
Glucose, Bld: 106 mg/dL — ABNORMAL HIGH (ref 70–99)
Potassium: 3.5 mmol/L (ref 3.5–5.1)
Sodium: 138 mmol/L (ref 135–145)
Total Bilirubin: 0.4 mg/dL (ref 0.3–1.2)
Total Protein: 7.8 g/dL (ref 6.5–8.1)

## 2019-12-30 LAB — URINALYSIS, ROUTINE W REFLEX MICROSCOPIC
Bilirubin Urine: NEGATIVE
Glucose, UA: NEGATIVE mg/dL
Hgb urine dipstick: NEGATIVE
Ketones, ur: NEGATIVE mg/dL
Leukocytes,Ua: NEGATIVE
Nitrite: NEGATIVE
Protein, ur: NEGATIVE mg/dL
Specific Gravity, Urine: 1.019 (ref 1.005–1.030)
pH: 6 (ref 5.0–8.0)

## 2019-12-30 LAB — LIPASE, BLOOD: Lipase: 25 U/L (ref 11–51)

## 2019-12-30 LAB — I-STAT BETA HCG BLOOD, ED (MC, WL, AP ONLY): I-stat hCG, quantitative: <5 m[IU]/mL (ref <5)

## 2019-12-30 MED ORDER — ONDANSETRON HCL 4 MG PO TABS: 4.0000 mg | ORAL_TABLET | Freq: Three times a day (TID) | ORAL | 0 refills | Status: DC | PRN

## 2019-12-30 MED ORDER — HYDROMORPHONE HCL 1 MG/ML IJ SOLN
1.0000 mg | Freq: Once | INTRAMUSCULAR | Status: AC
  Administered 2019-12-30: 1 mg via INTRAVENOUS
  Filled 2019-12-30: qty 1

## 2019-12-30 MED ORDER — ONDANSETRON HCL 4 MG/2ML IJ SOLN
4.0000 mg | Freq: Once | INTRAMUSCULAR | Status: AC
  Administered 2019-12-30: 4 mg via INTRAVENOUS
  Filled 2019-12-30: qty 2

## 2019-12-30 NOTE — ED Provider Notes (Signed)
[place] DEPT Provider Note   CSN: [id number] Arrival date & time: 12/30/19  1709     History Chief Complaint  Patient presents with  . flank pain  . Urinary Frequency    Rhonda Young is a 49 y.o. female  Who presents emergency department with chief complaint of flank pain and urinary frequency.  Patient states that she has had intermittent episodes of severe pain that generally starts in her right flank, radiates to her right shoulder in all the way around the front to the right upper quadrant of her abdomen.  She has episodes that are worsened after eating in which she feels constant, colicky, severe pain with associated nausea and feelings of vomiting.  She has associated belching and epigastric pain and occasional feelings of heartburn associated with this.  Patient feels like she has had some increased urination but denies hematuria or dysuria.  Dates that she was seen at an urgent care about a week ago.  They drew a urine that was negative but started her on an antibiotic and took a urine culture.  She states she does not know what the culture showed.  The pain again recurred and became worse today so she came in seeking evaluation.  HPI     Past Medical History:  Diagnosis Date  . 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 Anxiety   . Borderline diabetes    diet controlled  . H/O exercise stress test    ETT 3/17: No ST changes  . Hypertension   . Iron deficiency anemia    2/2 menstrual cycles  . Menorrhagia   . Obesity     There are no problems to display for this patient.   Past Surgical History:  Procedure Laterality Date  . bilateral tubal ligation  2006  . NASAL SINUS SURGERY  2013     OB History   No obstetric history on file.     Family History  Problem Relation Age of Onset  . Hypertension Mother   . Hemachromatosis Mother   . Hypertension Father   . Diabetes Brother   . Other Son        killed by a  drunk driver  . Heart attack Neg Hx   . Colon cancer Neg Hx   . Esophageal cancer Neg Hx   . Rectal cancer Neg Hx     Social History   Tobacco Use  . Smoking status: Never Smoker  . Smokeless tobacco: Never Used  Substance Use Topics  . Alcohol use: Yes    Comment: rare  . Drug use: No    Home Medications Prior to Admission medications   Medication Sig Start Date End Date Taking? Authorizing Provider  amitriptyline (ELAVIL) 25 MG tablet Take 25 mg by mouth at bedtime as needed.  12/11/15   [provider]  Aspirin-Salicylamide-Caffeine (BC HEADACHE POWDER PO) Take 1 packet by mouth daily as needed (headache & pain). For pain.    [provider]  Cholecalciferol (VITAMIN D3) 1.25 MG (50000 UT) CAPS Take 1 capsule by mouth once a week.    [provider]  FeFum-FePoly-FA-B Cmp-C-Biot (INTEGRA PLUS) CAPS Take 1 capsule by mouth daily. 11/27/15   [provider]  lisinopril-hydrochlorothiazide (PRINZIDE,ZESTORETIC) 10-12.5 MG tablet Take 1 tablet by mouth daily.    [provider]  Multiple Vitamins-Minerals (MULTIVITAMIN GUMMIES ADULT) CHEW Chew 1 tablet by mouth at bedtime.     [provider]  ondansetron (ZOFRAN) 4 MG tablet Take 4 mg by mouth every 6 (six) hours as needed for nausea or vomiting.    [provider]  pantoprazole (PROTONIX) 20 MG tablet Take 20 mg by mouth daily as needed.     [provider]  pantoprazole (PROTONIX) 40 MG tablet Take 1 tablet (40 mg total) by mouth daily. 09/03/18   Irene Shipper, MD  pantoprazole (PROTONIX) 40 MG tablet Take 1 tablet (40 mg total) by mouth daily. Please disregard previous rx - was done under the wrong doctor's name 09/03/18   Thornton Park, MD  PROAIR HFA 108 [phone] Base) MCG/ACT inhaler Inhale 2 puffs into the lungs every 4 (four) hours as needed for wheezing or shortness of breath.  12/06/15   [provider]  Probiotic Product (PROBIOTIC & ACIDOPHILUS EX  ST PO) Take 2 capsules by mouth daily as needed.     [provider]    Allergies    Patient has no known allergies.  Review of Systems   Review of Systems Ten systems reviewed and are negative for acute change, except as noted in the HPI.   Physical Exam Updated Vital Signs BP (!) 153/108 (BP Location: Right Arm)   Pulse (!) 120   Temp 98.9 F (37.2 C) (Oral)   Resp 16   Ht 5\' 2"  (1.575 m)   Wt 103 kg   LMP 12/07/2019   SpO2 98%   BMI 41.52 kg/m   Physical Exam Vitals and nursing note reviewed.  Constitutional:      General: She is not in acute distress.    Appearance: She is well-developed. She is not diaphoretic.  HENT:     Head: Normocephalic and atraumatic.  Eyes:     General: No scleral icterus.    Conjunctiva/sclera: Conjunctivae normal.  Cardiovascular:     Rate and Rhythm: Normal rate and regular rhythm.     Heart sounds: Normal heart sounds. No murmur. No friction rub. No gallop.   Pulmonary:     Effort: Pulmonary effort is normal. No respiratory distress.     Breath sounds: Normal breath sounds.  Abdominal:     General: Bowel sounds are normal. There is no distension.     Palpations: Abdomen is soft. There is no mass.     Tenderness: There is abdominal tenderness in the right upper quadrant. There is no right CVA tenderness, left CVA tenderness or guarding.  Musculoskeletal:     Cervical back: Normal range of motion.  Skin:    General: Skin is warm and dry.  Neurological:     Mental Status: She is alert and oriented to person, place, and time.  Psychiatric:        Behavior: Behavior normal.     ED Results / Procedures / Treatments   Labs (all labs ordered are listed, but only abnormal results are displayed) Labs Reviewed  CBC - Abnormal; Notable for the following components:      Result Value   Hemoglobin 11.6 (*)    MCV 79.6 (*)    MCH 24.7 (*)    RDW 15.9 (*)    Platelets 437 (*)    All other components within normal limits    COMPREHENSIVE METABOLIC PANEL - Abnormal; Notable for the following components:   Glucose, Bld 106 (*)    Creatinine, Ser 1.03 (*)    All other components within normal limits  SARS CORONAVIRUS 2 (TAT 6-24 HRS)  URINALYSIS, ROUTINE W  REFLEX MICROSCOPIC  LIPASE, BLOOD  I-STAT BETA HCG BLOOD, ED ([facility], [facility], [facility] ONLY)    EKG None  Radiology No results found.  Procedures Procedures (including critical care time)  Medications Ordered in ED Medications  HYDROmorphone (DILAUDID) injection 1 mg (1 mg Intravenous Given 12/30/19 1803)  ondansetron (ZOFRAN) injection 4 mg (4 mg Intravenous Given 12/30/19 1803)    ED Course  I have reviewed the triage vital signs and the nursing notes.  Pertinent labs & imaging results that were available during my care of the patient were reviewed by me and considered in my medical decision making (see chart for details).    MDM Rules/Calculators/A&P                      49 year old female here with recurrent episodes of abdominal pain.  Patient states this has been going on for years.  This sounds very much like biliary colic.  The differential diagnosis for right upper quadrant abdominal pain includes but is not limited to Glabladder disease, PUD, Acute Hepatitis, Pancreatitis, pyelonephritis, Pneumonia, Lower lobe PE/Infarct, Kidney stone, GERD, retrocecal appendicitis, Fitz-Hugh-Curtis syndrome, AAA, MI, Zoster.  Has no patient has no pain at this time.  I reviewed the patient's labs which shows a normal lipase, negative pregnancy test, CBC shows mild microcytic anemia, CMP shows slightly elevated blood glucose, mildly elevated creatinine of likely insignificant value.  She has no leukocytosis and is afebrile.  Patient is tender in the right upper quadrant on examination.  I reviewed her urinalysis which shows no abnormalities.  Ultrasound of the abdomen shows a stable right adrenal adenoma, her Rosanne Gutting system is unremarkable on my interpretation, no evidence of  stones or cholecystitis. The patient's symptoms however are highly concerning for biliary colic.  She is feeling well at this time and I think that she can follow-up with surgery for further evaluation and potential HIDA scan.  Patient be discharged with Zofran.  She appears appropriate for discharge at this time Final Clinical Impression(s) / ED Diagnoses Final diagnoses:  RUQ pain    Rx / DC Orders ED Discharge Orders    None      Margarita Mail, PA-C 12/30/19 2306  Milton Ferguson, MD 01/02/20 [phone]

## 2019-12-30 NOTE — Discharge Instructions (Signed)
Contact a health care provider if: Your pain lasts more than 5 hours. You vomit. You have a fever and chills. Your pain gets worse. Get help right away if: Your skin or the whites of your eyes look yellow (jaundice). Your have tea-colored urine and light-colored stools. You are dizzy or you faint.

## 2019-12-30 NOTE — ED Triage Notes (Signed)
Patient reports that she had right flank a week ago and went to an UC. Patient states she was started on an antibiotic, but has not finished the prescription. Patient states the pain is worse. Pain now radiates into the right lower abdomen and into the right lower back.  Patient also c/o urinary frequency.

## 2019-12-31 LAB — SARS CORONAVIRUS 2 (TAT 6-24 HRS): SARS Coronavirus 2: NEGATIVE

## 2020-01-13 ENCOUNTER — Ambulatory Visit: Payer: BC Managed Care – PPO | Attending: Internal Medicine

## 2020-01-13 DIAGNOSIS — Z20822 Contact with and (suspected) exposure to covid-19: Secondary | ICD-10-CM

## 2020-01-14 LAB — NOVEL CORONAVIRUS, NAA: SARS-CoV-2, NAA: NOT DETECTED

## 2020-01-25 ENCOUNTER — Ambulatory Visit: Payer: BC Managed Care – PPO | Attending: Internal Medicine

## 2020-01-25 DIAGNOSIS — Z20822 Contact with and (suspected) exposure to covid-19: Secondary | ICD-10-CM

## 2020-01-26 LAB — NOVEL CORONAVIRUS, NAA: SARS-CoV-2, NAA: NOT DETECTED

## 2020-05-02 ENCOUNTER — Ambulatory Visit: Payer: BC Managed Care – PPO | Attending: Internal Medicine

## 2020-05-02 DIAGNOSIS — Z20822 Contact with and (suspected) exposure to covid-19: Secondary | ICD-10-CM

## 2020-05-03 LAB — SARS-COV-2, NAA 2 DAY TAT

## 2020-05-03 LAB — NOVEL CORONAVIRUS, NAA: SARS-CoV-2, NAA: NOT DETECTED

## 2020-06-29 ENCOUNTER — Other Ambulatory Visit: Payer: Self-pay

## 2020-06-29 ENCOUNTER — Other Ambulatory Visit: Payer: BC Managed Care – PPO

## 2020-06-29 DIAGNOSIS — Z20822 Contact with and (suspected) exposure to covid-19: Secondary | ICD-10-CM

## 2020-06-30 LAB — NOVEL CORONAVIRUS, NAA: SARS-CoV-2, NAA: NOT DETECTED

## 2020-10-11 ENCOUNTER — Ambulatory Visit (INDEPENDENT_AMBULATORY_CARE_PROVIDER_SITE_OTHER): Payer: BC Managed Care – PPO | Admitting: Gastroenterology

## 2020-10-11 ENCOUNTER — Encounter: Payer: Self-pay | Admitting: Gastroenterology

## 2020-10-11 VITALS — BP 124/84 | HR 87 | Ht 62.0 in | Wt 241.0 lb

## 2020-10-11 DIAGNOSIS — R1012 Left upper quadrant pain: Secondary | ICD-10-CM | POA: Diagnosis not present

## 2020-10-11 DIAGNOSIS — K5900 Constipation, unspecified: Secondary | ICD-10-CM

## 2020-10-11 MED ORDER — PLENVU 140 G PO SOLR: 1.0000 | Freq: Once | ORAL | 0 refills | Status: AC

## 2020-10-11 NOTE — Patient Instructions (Addendum)
I would like to check your thyroid given your constipation.  Your provider has requested that you go to the basement level for lab work before leaving today. Press "B" on the elevator. The lab is located at the first door on the left as you exit the elevator.   Please drink at least 64 ounces of water daily, eat a high fiber diet, and try to get regular exercise.   Magnesium oxide 400 mg daily may help with your constipation without causing the blow-outs that you are experiencing with the Linzess. However, you could still use the Linzess as needed if the constipation is getting worse.   We will work on a referral to the nutritionist. I also recommend that you check out the [facility] App! Working towards a healthy weight will improve your gut health. The [facility] will contact you with appt date and time.  We will plan an EGD to evaluate your abdominal pain and a screening colonoscopy.  You have been scheduled for an endoscopy and colonoscopy. Please follow the written instructions given to you at your visit today. Please pick up your prep supplies at the pharmacy within the next 1-3 days. If you use inhalers (even only as needed), please bring them with you on the day of your procedure.   Tips for colonoscopy:  - Stay well hydrated for 3-4 days prior to the exam. This reduces nausea and dehydration.  - To prevent skin/hemorrhoid irritation - prior to wiping, put A&Dointment or vaseline on the toilet paper. - Keep a towel or pad on the bed.  - Drink  64oz of clear liquids in the morning of prep day (prior to starting the prep) to be sure that there is enough fluid to flush the colon and stay hydrated!!!! This is in addition to the fluids required for preparation. - Use of a flavored hard candy, such as grape Anise Salvo, can counteract some of the flavor of the prep and may prevent some nausea.   You have been scheduled for a HIDA scan at [facility]  Radiology (1st floor) on 10/29/20. Please arrive 15 minutes prior to your scheduled appointment at  8:29HB. Make certain not to have anything to eat or drink at least 6 hours prior to your test. Should this appointment date or time not work well for you, please call radiology scheduling at [phone].  _____________________________________________________________________ hepatobiliary (HIDA) scan is an imaging procedure used to diagnose problems in the liver, gallbladder and bile ducts. In the HIDA scan, a radioactive chemical or tracer is injected into a vein in your arm. The tracer is handled by the liver like bile. Bile is a fluid produced and excreted by your liver that helps your digestive system break down fats in the foods you eat. Bile is stored in your gallbladder and the gallbladder releases the bile when you eat a meal. A special nuclear medicine scanner (gamma camera) tracks the flow of the tracer from your liver into your gallbladder and small intestine.  During your HIDA scan  You'll be asked to change into a hospital gown before your HIDA scan begins. Your health care team will position you on a table, usually on your back. The radioactive tracer is then injected into a vein in your arm.The tracer travels through your bloodstream to your liver, where it's taken up by the bile-producing cells. The radioactive tracer travels with the bile from your liver into your gallbladder and through your bile ducts to your  small intestine.You may feel some pressure while the radioactive tracer is injected into your vein. As you lie on the table, a special gamma camera is positioned over your abdomen taking pictures of the tracer as it moves through your body. The gamma camera takes pictures continually for about an hour. You'll need to keep still during the HIDA scan. This can become uncomfortable, but you may find that you can lessen the discomfort by taking deep breaths and thinking about other things. Tell  your health care team if you're uncomfortable. The radiologist will watch on a computer the progress of the radioactive tracer through your body. The HIDA scan may be stopped when the radioactive tracer is seen in the gallbladder and enters your small intestine. This typically takes about an hour. In some cases extra imaging will be performed if original images aren't satisfactory, if morphine is given to help visualize the gallbladder or if the medication CCK is given to look at the contraction of the gallbladder. This test typically takes 2 hours to complete. ________________________________________________________________________

## 2020-10-11 NOTE — Progress Notes (Signed)
Referring Provider: Lawerance Cruel, MD Primary Care Physician:  Lawerance Cruel, MD   Reason for Consultation: Constipation   IMPRESSION:  Chronic Constipation not adequately controlled with Linzess LUQ and epigastric abdominal pain x years    - normal ultrasound 08/05/18     - ultrasound  Frequent NSAIDs BMI of 44 Psychosocial stress No prior colon cancer screening   Discussed strategies for managing constipation. Will screen for thyroid dysfunction. Discussed dietary recommendations. Trial of magnesium oxide 400-800 mg daily. Use Linzess PRN.   Differential for her abdominal pain is extensive. Given her NSAIDs and anemia, PUD is very likely. Must also consider reflux esophagitis and non-erosive reflux disease exacerbated by recent weight gain, H pylori, and gastritis. Ultrasound and ER labs provide some reassurance and history makes pancreaticobiliary etiologies less likely.  There may be functional overlay. Given her concerns after ER evaluation, will plan HIDA scan with [facility].   Discussed weight gain and strategies for weight loss.   PLAN: - TSH - HIDA scan to evaluate for symptomatic gallbladder disease - EGD to evaluate for PUD, H pylori, gastritis, esophagitis, and less likely malignancy - Screening Colonoscopy  - Avoid NSAIDs - Magnesium oxide 400 mg daily, high fiber diet, drink at least 64 ounces of water, regular exercise - Patient requested referral to nutritionist with discussed dietary and exercise approaches to weight loss (including [place])  HPI: Rhonda Young is a 49 y.o. female last seen by me in 2019 for abdominal pain. Referred now by [facility] for further evaluation of constipation.  The interval history is obtained through the patient, review of her electronic health record, and records provided by [facility].  She has vitamin D deficiency, obesity, hypertension, low back pain.  She was referred for constipation, which was reported to me in 2019  but not a major symptom at the time. Frequently goes up to a week between bowel movements.  No significant improvement with enemas, lactulose, or multiple over the counter medications ("I've tried them all").  Trial of Linzess PRN (she does not know the dose) did not provide relief until the second week.  Doesn't like to take it while working and doesn't like the explosive bowel movements that can occur.  No sense of complete evacuation. No blood or mucous in the stool.   Continues to have the intermittent abdominal pain that she reported in 2019.  Primarily in the LUQ and epigastrium and radiates around to the back. Occurs every week to every month. Length of symptoms varies.  She has worried it could be symptomatic gallbladder disease.  Pain evaluated in the ED at [facility] 12/30/2019.  An abdominal ultrasound showed hepatic steatosis and a stable right adenoma but was otherwise normal.  Labs showed mild microcytic anemia.  She is referred to [facility] surgery to consider HIDA but did not make an appointment.  Labs 07/12/2020 show a hemoglobin of 10.9, MCV 76.8, RDW 15.1, platelets 527, normal CMP including calcium   She is concerned that her weight and stress may be contributing to the pain. Uses NSAIDs for headaches.    Mother had similar symptoms. Died 3 weeks after her presentation. She had a history of hemachromatosis and alcohol use. This has her scared and has her worried about finding a specific diagnosis.   No history of endoscopy.   Past Medical History:  Diagnosis Date  . 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 Anxiety   . Borderline  diabetes    diet controlled  . H/O exercise stress test    ETT 3/17: No ST changes  . Hypertension   . Iron deficiency anemia    2/2 menstrual cycles  . Menorrhagia   . Obesity     Past Surgical History:  Procedure Laterality Date  . bilateral tubal ligation  2006  . NASAL SINUS SURGERY  2013    Current Outpatient  Medications  Medication Sig Dispense Refill  . Aspirin-Salicylamide-Caffeine (BC HEADACHE POWDER PO) Take 1 packet by mouth daily as needed (headache & pain). For pain.    . Cholecalciferol (VITAMIN D3) 1.25 MG (50000 UT) CAPS Take 1 capsule by mouth once a week.    . FeFum-FePoly-FA-B Cmp-C-Biot (INTEGRA PLUS) CAPS Take 1 capsule by mouth daily.  1  . lisinopril-hydrochlorothiazide (PRINZIDE,ZESTORETIC) 10-12.5 MG tablet Take 1 tablet by mouth daily.    . Multiple Vitamins-Minerals (MULTIVITAMIN GUMMIES ADULT) CHEW Chew 1 tablet by mouth at bedtime.      No current facility-administered medications for this visit.    Allergies as of 10/11/2020  . (No Known Allergies)    Family History  Problem Relation Age of Onset  . Hypertension Mother   . Hemachromatosis Mother   . Hypertension Father   . Diabetes Brother   . Other Son        killed by a drunk driver  . Heart attack Neg Hx   . Colon cancer Neg Hx   . Esophageal cancer Neg Hx   . Rectal cancer Neg Hx     Social History   Socioeconomic History  . Marital status: Single    Spouse name: Not on file  . Number of children: 3  . Years of education: Not on file  . Highest education level: Not on file  Occupational History  . Occupation: Teaching laboratory technician: [employer]  Tobacco Use  . Smoking status: Never Smoker  . Smokeless tobacco: Never Used  Vaping Use  . Vaping Use: Never used  Substance and Sexual Activity  . Alcohol use: Yes    Comment: rare  . Drug use: No  . Sexual activity: Not on file  Other Topics Concern  . Not on file  Social History Narrative   Works at [employer] [place]   3 children (1 child deceased - killed by drunk driver)   Originally from [place] - moved to [place] [address]s   Social Determinants of Radio broadcast assistant Strain: Not on [employer] Insecurity: Not on file  Transportation Needs: Not on file  Physical Activity: Not on file  Stress: Not on file  Social  Connections: Not on file  Intimate Partner Violence: Not on file   Physical Exam: Vital signs were reviewed. General:   Alert, well-nourished, pleasant and cooperative in NAD Head:  Normocephalic and atraumatic. Eyes:  Sclera clear, no icterus.   Conjunctiva pink. Mouth:  No deformity or lesions.   Neck:  Supple; no thyromegaly. Lungs:  Clear throughout to auscultation.   No wheezes.  Heart:  Regular rate and rhythm; no murmurs Abdomen:  Soft, nontender, central obesity, normal bowel sounds. Although I cannot reproduce her pain, she localizes it to the LUQ and RUQ. No rebound or guarding. No hepatosplenomegaly. Rectal:  Deferred  Msk:  Symmetrical without gross deformities. Extremities:  No gross deformities or edema. Neurologic:  Alert and  oriented x4;  grossly nonfocal Skin:  No rash or bruise. Psych:  Alert  and cooperative. Normal mood and affect.   Sita Mangen L. Tarri Glenn Md, MPH [facility] 10/11/2020, 10:51 AM

## 2020-10-12 ENCOUNTER — Other Ambulatory Visit: Payer: Self-pay

## 2020-10-12 ENCOUNTER — Other Ambulatory Visit (INDEPENDENT_AMBULATORY_CARE_PROVIDER_SITE_OTHER): Payer: BC Managed Care – PPO

## 2020-10-12 DIAGNOSIS — K5900 Constipation, unspecified: Secondary | ICD-10-CM | POA: Diagnosis not present

## 2020-10-12 DIAGNOSIS — R1012 Left upper quadrant pain: Secondary | ICD-10-CM | POA: Diagnosis not present

## 2020-10-12 LAB — TSH: TSH: 1.31 u[IU]/mL (ref 0.35–4.50)

## 2020-10-15 ENCOUNTER — Other Ambulatory Visit: Payer: Self-pay

## 2020-10-15 ENCOUNTER — Encounter: Payer: BC Managed Care – PPO | Attending: Gastroenterology | Admitting: Dietician

## 2020-10-15 ENCOUNTER — Encounter: Payer: Self-pay | Admitting: Dietician

## 2020-10-15 DIAGNOSIS — K5900 Constipation, unspecified: Secondary | ICD-10-CM | POA: Diagnosis not present

## 2020-10-15 NOTE — Patient Instructions (Addendum)
Eat your Multivitamin AFTER your meal to avoid nausea.  Look into finding a walking partner to help you stay accountable.  Try to find some good stretched for your calves, hamstrings, and neck.   Look for some time to fit in your dancing every week.  Begin to take either Metamucil, or Benefiber daily to increase fiber intake.  Work towards making your meals fit the balanced plate model.

## 2020-10-15 NOTE — Progress Notes (Signed)
Medical Nutrition Therapy   Primary concerns today: Constipation  Referral diagnosis: K59.00 Consitpation: unspecified, R10.12 LUQ Pain Preferred learning style: No preference indicated Learning readiness: Contemplating   NUTRITION ASSESSMENT   Anthropometrics  Pt declined to be weighed at our appointment   Clinical Medical Hx: LUQ,  RUQ pain, Peptic ulcer, abdominal pain, GERD Medications: Lisinopril Labs: Hgb - 11.6 (low), MCV - 79.6 (low),  Creatinine - 1.03 (high) Notable Signs/Symptoms: Abdominal pain when nervous, occasional reflux  Lifestyle & Dietary Hx Pt reports having left leg pain, and was tested for blood clots. Pt is awaiting a colonoscopy and endoscopy in the upcoming week. Pt suffered a broken foot at the end of August, has to use a brace. Pain comes and goes. Pt has history of abdominal pain, and constipation. Has tried magnesium citrate, enema but was unable to alleviate it. Pt reports drinking epsom salts to help pass her stool. Pt reports anxiety when being constipated. Pt also reports anxiety due to Googling health concerns. Pt reports not having confidence in some of her providers, feels that they were dismissive and just gave her pills.  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 Does not take lunch break. May snack on popcorn, apples, or bananas. Pt states that she does not want to go to the bathroom at work. Pt wants to do more physical activity. Was walking 2 miles a day before she broke her foot. Pt reports lower back pain, has been shown stretches in the past   Pt reports limited adherance to all medications. Pt states her biggest problem is consistency.  Estimated daily fluid intake: ~100-120 oz Supplements: Gummy MV (reports nausea occasionally) Vitamin D Sleep: Wakes up sore, leg pain. Doesn't sleep well, pees ~3 times a night. Stress / self-care: Pretty high Current average weekly physical activity: Very limited. Likes to  dance  24-Hr Dietary Recall First Meal: (1 pm) 6 oz ribeye, 3 scrambled eggs with salt and black pepper, Dr Malachi Bonds, ~40 oz water Snack: none Second Meal: (7 pm) 2 small boxes of raisins, popcorn Snack: none Third Meal: (11 pm) 1 can of tuna, 10 saltine crackers, ~40 oz water. Snack: none Beverages: 2 12 oz Dr. Malachi Bonds, water   NUTRITION DIAGNOSIS  NB-1.1 Food and nutrition-related knowledge deficit As related to constipation.  As evidenced by history of constipation and RUQ and LUQ pain, and dietary recall very low in fiber..   NUTRITION INTERVENTION  Nutrition education (E-1) on the following topics:  . Constipation  Educate pt on the potential nutritional factors that may contribute to constipation, and strategies to improve GI motility. Educate pt on the importance of consistent, daily fiber intake. Recommend a minimum of 25 g a day. Advise pt to increase fiber intake slowly over time to avoid further GI distress. Recommend pt continue to drink ~100 oz of water daily. Recommend the addition of Benefiber or Metamucil at the same time daily to begin to increase fiber intake. Educate patient on the balanced plate eating model. Recommend lunch and dinner be 1/2 non-starchy vegetables, 1/4 starches, and 1/4 protein. Recommend breakfast be a balance of starch and protein with a piece of fruit. Discuss the importance of working towards hitting the proportions of the balanced plate consistently. Educate patient on the nutritional value of each food group on the balanced plate model. Counsel patient on beginning to rebuild their trust in themselves to make the right food choices for their health.  Handouts Provided Include   Constipation  Nutrition Therapy Nutrition Care Manual  Balanced Plate  Balanced Plate Food List  Learning Style & Readiness for Change Teaching method utilized: Visual & Auditory  Demonstrated degree of understanding via: Teach Back  Barriers to learning/adherence to  lifestyle change: Consistency  Goals Established by Pt  Eat your Multivitamin AFTER your meal to avoid nausea.  Look into finding a walking partner to help you stay accountable.  Try to find some good stretched for your calves, hamstrings, and neck.   Look for some time to fit in your dancing every week.  Begin to take either Metamucil, or Benefiber daily to increase fiber intake.  Work towards making your meals fit the balanced plate model.   MONITORING & EVALUATION Dietary intake, weekly physical activity, and frequency of constipation in 2 months.  Next Steps  Patient is to attend her colonoscopy and endoscopy, and follow up with dietitian.

## 2020-10-17 ENCOUNTER — Emergency Department (HOSPITAL_BASED_OUTPATIENT_CLINIC_OR_DEPARTMENT_OTHER): Payer: BC Managed Care – PPO

## 2020-10-17 ENCOUNTER — Emergency Department (HOSPITAL_COMMUNITY)
Admission: EM | Admit: 2020-10-17 | Discharge: 2020-10-17 | Disposition: A | Discharge status: Home / Self Care | Payer: BC Managed Care – PPO | Attending: Emergency Medicine | Admitting: Emergency Medicine

## 2020-10-17 ENCOUNTER — Encounter (HOSPITAL_COMMUNITY): Payer: Self-pay | Admitting: Emergency Medicine

## 2020-10-17 DIAGNOSIS — M7989 Other specified soft tissue disorders: Secondary | ICD-10-CM | POA: Diagnosis not present

## 2020-10-17 DIAGNOSIS — I10 Essential (primary) hypertension: Secondary | ICD-10-CM | POA: Insufficient documentation

## 2020-10-17 DIAGNOSIS — Z7982 Long term (current) use of aspirin: Secondary | ICD-10-CM | POA: Insufficient documentation

## 2020-10-17 DIAGNOSIS — R609 Edema, unspecified: Secondary | ICD-10-CM

## 2020-10-17 DIAGNOSIS — M79605 Pain in left leg: Secondary | ICD-10-CM

## 2020-10-17 DIAGNOSIS — Z79899 Other long term (current) drug therapy: Secondary | ICD-10-CM | POA: Insufficient documentation

## 2020-10-17 LAB — BASIC METABOLIC PANEL
Anion gap: 11 (ref 5–15)
BUN: 11 mg/dL (ref 6–20)
CO2: 22 mmol/L (ref 22–32)
Calcium: 9.2 mg/dL (ref 8.9–10.3)
Chloride: 106 mmol/L (ref 98–111)
Creatinine, Ser: 0.97 mg/dL (ref 0.44–1.00)
GFR, Estimated: >60 mL/min (ref >60)
Glucose, Bld: 92 mg/dL (ref 70–99)
Potassium: 4 mmol/L (ref 3.5–5.1)
Sodium: 139 mmol/L (ref 135–145)

## 2020-10-17 LAB — CBC WITH DIFFERENTIAL/PLATELET
Abs Immature Granulocytes: 0.02 10*3/uL (ref 0.00–0.07)
Basophils Absolute: 0.1 10*3/uL (ref 0.0–0.1)
Basophils Relative: 1 %
Eosinophils Absolute: 0.1 10*3/uL (ref 0.0–0.5)
Eosinophils Relative: 1 %
HCT: 39 % (ref 36.0–46.0)
Hemoglobin: 11.2 g/dL — ABNORMAL LOW (ref 12.0–15.0)
Immature Granulocytes: 0 %
Lymphocytes Relative: 29 %
Lymphs Abs: 1.6 10*3/uL (ref 0.7–4.0)
MCH: 23.3 pg — ABNORMAL LOW (ref 26.0–34.0)
MCHC: 28.7 g/dL — ABNORMAL LOW (ref 30.0–36.0)
MCV: 81.1 fL (ref 80.0–100.0)
Monocytes Absolute: 0.6 10*3/uL (ref 0.1–1.0)
Monocytes Relative: 11 %
Neutro Abs: 3.3 10*3/uL (ref 1.7–7.7)
Neutrophils Relative %: 58 %
Platelets: 321 10*3/uL (ref 150–400)
RBC: 4.81 MIL/uL (ref 3.87–5.11)
RDW: 16.2 % — ABNORMAL HIGH (ref 11.5–15.5)
WBC: 5.7 10*3/uL (ref 4.0–10.5)
nRBC: 0 % (ref 0.0–0.2)

## 2020-10-17 LAB — CK: Total CK: 225 U/L (ref 38–234)

## 2020-10-17 MED ORDER — METHOCARBAMOL 500 MG PO TABS: 500.0000 mg | ORAL_TABLET | Freq: Two times a day (BID) | ORAL | 0 refills | Status: AC

## 2020-10-17 MED ORDER — NAPROXEN 500 MG PO TABS: 500.0000 mg | ORAL_TABLET | Freq: Two times a day (BID) | ORAL | 0 refills | Status: AC

## 2020-10-17 NOTE — ED Provider Notes (Signed)
[facility] DEPT Provider Note   CSN: [id number] Arrival date & time: 10/17/20  1025    History Multiple complaints   Rhonda Young is a 49 y.o. female with history significant for hypertension, obesity who presents for evaluation multiple complaints.  Patient states she has had pain to her left calf over the last 2 weeks.  Has tried to "walk it out" as well as stretch and use compression socks.  Went to urgent care and had an elevated D-dimer, she does not know what this was and they recommended coming here to for an ultrasound to rule out DVT.  She denies any chest pain, shortness of breath.  She has not noted any unilateral leg swelling, redness or warmth.  No recent surgery, lesion, history of malignancy, clotting disorder, PE or DVT.  Rates her pain a 4/10.  No hemoptysis, cough.  No recent trauma or injury.  Patient states "it feels like my muscles are tight."  He has not taken anything for symptoms.  Patient states she also inserted a tampon 3 weeks ago.  Patient states she removed a tampon but she does not remember when she inserted the plastic applicator had come out.  She fears this may still be in her vagina.  She has palpated manually and is unable to palpate anything.  States she does feel significantly anxious.  She denies any pelvic pain, vaginal discharge, dysuria, hematuria.  She is not sexually active.  No abdominal pain.  Urinating without difficulty.  Last bowel movement just PTA.  Denies fever, chills, nausea vomiting, chest pain, shortness of breath, hemoptysis, cough, abdominal pain, diarrhea, dysuria, lateral leg swelling, redness or warmth.  Denies additional rating or alleviating factors.  History obtained from patient and past medical records.  No interpreter used.  HPI     Past Medical History:  Diagnosis Date  . 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 Anxiety   . Borderline diabetes    diet controlled  . H/O  exercise stress test    ETT 3/17: No ST changes  . Hypertension   . Iron deficiency anemia    2/2 menstrual cycles  . Menorrhagia   . Obesity     There are no problems to display for this patient.   Past Surgical History:  Procedure Laterality Date  . bilateral tubal ligation  2006  . NASAL SINUS SURGERY  2013     OB History   No obstetric history on file.     Family History  Problem Relation Age of Onset  . Hypertension Mother   . Hemachromatosis Mother   . Hypertension Father   . Diabetes Brother   . Other Son        killed by a drunk driver  . Heart attack Neg Hx   . Colon cancer Neg Hx   . Esophageal cancer Neg Hx   . Rectal cancer Neg Hx     Social History   Tobacco Use  . Smoking status: Never Smoker  . Smokeless tobacco: Never Used  Vaping Use  . Vaping Use: Never used  Substance Use Topics  . Alcohol use: Yes    Comment: rare  . Drug use: No    Home Medications Prior to Admission medications   Medication Sig Start Date End Date Taking? Authorizing Provider  Aspirin-Salicylamide-Caffeine (BC HEADACHE POWDER PO) Take 1 packet by mouth daily as needed (headache & pain). For pain.    [provider]  Cholecalciferol (VITAMIN D3) 1.25 MG (50000 UT) CAPS Take 1 capsule by mouth once a week.    [provider]  FeFum-FePoly-FA-B Cmp-C-Biot (INTEGRA PLUS) CAPS Take 1 capsule by mouth daily. 11/27/15   [provider]  lisinopril-hydrochlorothiazide (PRINZIDE,ZESTORETIC) 10-12.5 MG tablet Take 1 tablet by mouth daily.    [provider]  methocarbamol (ROBAXIN) 500 MG tablet Take 1 tablet (500 mg total) by mouth 2 (two) times daily. 10/17/20   Syerra Abdelrahman A, PA-C  Multiple Vitamins-Minerals (MULTIVITAMIN GUMMIES ADULT) CHEW Chew 1 tablet by mouth at bedtime.     [provider]  naproxen (NAPROSYN) 500 MG tablet Take 1 tablet (500 mg total) by mouth 2 (two) times daily. 10/17/20   Shatera Rennert A, PA-C     Allergies    Patient has no known allergies.  Review of Systems   Review of Systems  Constitutional: Negative.   HENT: Negative.   Respiratory: Negative.   Cardiovascular: Negative.   Gastrointestinal: Negative.   Genitourinary: Negative.   Musculoskeletal:       Left calf pain  Skin: Negative.   Neurological: Negative.   All other systems reviewed and are negative.   Physical Exam Updated Vital Signs BP (!) 149/95   Pulse 82   Temp 98.1 F (36.7 C) (Oral)   Resp 18   LMP 09/16/2020   SpO2 99%   Physical Exam Vitals and nursing note reviewed.  Constitutional:      General: She is not in acute distress.    Appearance: She is well-developed and well-nourished. She is not ill-appearing, toxic-appearing or diaphoretic.  HENT:     Head: Normocephalic and atraumatic.     Nose: Nose normal.     Mouth/Throat:     Mouth: Mucous membranes are moist.  Eyes:     Pupils: Pupils are equal, round, and reactive to light.  Cardiovascular:     Rate and Rhythm: Normal rate.     Pulses: Normal pulses and intact distal pulses.          Dorsalis pedis pulses are 2+ on the right side and 2+ on the left side.       Posterior tibial pulses are 2+ on the right side and 2+ on the left side.     Heart sounds: Normal heart sounds.  Pulmonary:     Effort: Pulmonary effort is normal. No respiratory distress.     Breath sounds: Normal breath sounds.  Abdominal:     General: Bowel sounds are normal. There is no distension.  Genitourinary:    Comments: Normal appearing external female genitalia without rashes or lesions, normal vaginal epithelium. Normal appearing cervix without discharge or petechiae. Cervical os is closed. There is no  bleeding noted at the os. No odor. Bimanual: No CMT, nontender.  No palpable adnexal masses or tenderness. Uterus midline and not fixed. Rectovaginal exam was deferred. NO foreign body utilized or palpated in vaginal vault no cystocele or rectocele noted. No  pelvic lymphadenopathy noted. Exam performed with chaperone in room. Musculoskeletal:        General: Normal range of motion.     Cervical back: Normal range of motion.     Right upper leg: Normal.     Left upper leg: Normal.     Right knee: Normal.     Left knee: Normal.     Right lower leg: Normal. No deformity, lacerations, tenderness or bony tenderness.     Left lower leg: Tenderness present.  Right ankle: Normal.     Left ankle: Normal.     Right foot: Normal.     Left foot: Normal.       Legs:     Comments: Moves all 4 extremities without difficulty.  Compartments soft.  Bevelyn Buckles' sign negative.  Full range of motion bilateral lower extremities with flexion and extension.  Minimal tenderness to posterior left calf.  No overlying skin changes.  Feet:     Right foot:     Skin integrity: Skin integrity normal.     Left foot:     Skin integrity: Skin integrity normal.  Skin:    General: Skin is warm and dry.     Capillary Refill: Capillary refill takes less than 2 seconds.     Comments: No edema, erythema or warmth.  No fluctuance or induration.  Neurological:     General: No focal deficit present.     Mental Status: She is alert and oriented to person, place, and time.     Cranial Nerves: Cranial nerves are intact.     Sensory: Sensation is intact.     Motor: Motor function is intact.     Gait: Gait is intact.     Comments: Cranial nerves II through grossly intact Ambulatory without difficulty Intact sensation  Psychiatric:        Mood and Affect: Mood and affect normal.     ED Results / Procedures / Treatments   Labs (all labs ordered are listed, but only abnormal results are displayed) Labs Reviewed  CBC WITH DIFFERENTIAL/PLATELET - Abnormal; Notable for the following components:      Result Value   Hemoglobin 11.2 (*)    MCH 23.3 (*)    MCHC 28.7 (*)    RDW 16.2 (*)    All other components within normal limits  BASIC METABOLIC PANEL  CK     EKG None  Radiology VAS [place] LOWER EXTREMITY VENOUS (DVT) (ONLY [facility] & [facility])  Result Date: 10/17/2020  Lower Venous DVT Study Indications: Edema, and Swelling.  Comparison Study: no prior Performing Technologist: Abram Sander RVS  Examination Guidelines: A complete evaluation includes B-mode imaging, spectral Doppler, color Doppler, and power Doppler as needed of all accessible portions of each vessel. Bilateral testing is considered an integral part of a complete examination. Limited examinations for reoccurring indications may be performed as noted. The reflux portion of the exam is performed with the patient in reverse Trendelenburg.  +---------+---------------+---------+-----------+----------+--------------+ RIGHT    CompressibilityPhasicitySpontaneityPropertiesThrombus Aging +---------+---------------+---------+-----------+----------+--------------+ CFV      Full           Yes      Yes                                 +---------+---------------+---------+-----------+----------+--------------+ SFJ      Full                                                        +---------+---------------+---------+-----------+----------+--------------+ FV Prox  Full                                                        +---------+---------------+---------+-----------+----------+--------------+  FV Mid   Full                                                        +---------+---------------+---------+-----------+----------+--------------+ FV Distal               Yes      Yes                                 +---------+---------------+---------+-----------+----------+--------------+ PFV      Full                                                        +---------+---------------+---------+-----------+----------+--------------+ POP      Full           Yes      Yes                                 +---------+---------------+---------+-----------+----------+--------------+ PTV       Full                                                        +---------+---------------+---------+-----------+----------+--------------+ PERO     Full                                                        +---------+---------------+---------+-----------+----------+--------------+   +---------+---------------+---------+-----------+----------+--------------+ LEFT     CompressibilityPhasicitySpontaneityPropertiesThrombus Aging +---------+---------------+---------+-----------+----------+--------------+ CFV      Full           Yes      Yes                                 +---------+---------------+---------+-----------+----------+--------------+ SFJ      Full                                                        +---------+---------------+---------+-----------+----------+--------------+ FV Prox  Full                                                        +---------+---------------+---------+-----------+----------+--------------+ FV Mid                  Yes      Yes                                 +---------+---------------+---------+-----------+----------+--------------+  FV Distal               Yes      Yes                                 +---------+---------------+---------+-----------+----------+--------------+ PFV      Full                                                        +---------+---------------+---------+-----------+----------+--------------+ POP      Full           Yes      Yes                                 +---------+---------------+---------+-----------+----------+--------------+ PTV      Full                                                        +---------+---------------+---------+-----------+----------+--------------+ PERO     Full                                                        +---------+---------------+---------+-----------+----------+--------------+     Summary: BILATERAL: - No evidence of deep vein  thrombosis seen in the lower extremities, bilaterally. - No evidence of superficial venous thrombosis in the lower extremities, bilaterally. -No evidence of popliteal cyst, bilaterally.   *See table(s) above for measurements and observations.    Preliminary     Procedures Pelvic exam  Date/Time: 10/17/2020 4:22 PM Performed by: Nettie Elm, PA-C Authorized by: Nettie Elm, PA-C  Consent: Verbal consent obtained. Risks and benefits: risks, benefits and alternatives were discussed Consent given by: patient Patient understanding: patient states understanding of the procedure being performed Patient consent: the patient's understanding of the procedure matches consent given Procedure consent: procedure consent matches procedure scheduled Relevant documents: relevant documents present and verified Test results: test results available and properly labeled Site marked: the operative site was marked Required items: required blood products, implants, devices, and special equipment available Patient identity confirmed: verbally with patient Preparation: Patient was prepped and draped in the usual sterile fashion. Local anesthesia used: no  Anesthesia: Local anesthesia used: no  Sedation: Patient sedated: no  Patient tolerance: patient tolerated the procedure well with no immediate complications    (including critical care time)  Medications Ordered in ED Medications - No data to display  ED Course  I have reviewed the triage vital signs and the nursing notes.  Pertinent labs & imaging results that were available during my care of the patient were reviewed by me and considered in my medical decision making (see chart for details).  49 year old presents for evaluation multiple complaints.  Patient with left calf pain x2 weeks.  She has no overlying skin changes.  No unilateral leg swelling, redness or warmth.  She is without tachycardia, tachypnea or hypoxia.  She has no  chest pain, shortness of breath or hemoptysis.  No recent surgery, immobilization, malignancy, history of clotting disorders, PE or DVT.  She is PERC negative, Wells criteria low risk.  Her compartments are soft.  She has no bony tenderness.  She is neurovascularly intact.  No recent injury or trauma.  I do not visualize any edema, erythema or warmth to her extremities.  Does have some mild tenderness at her left mid calf.  Does seem to worsen when she plantar flexes at her lower extremity.  Ultrasound here reassuring without any evidence of DVT.  Given she denies any chest pain, shortness of breath I have low suspicion for PE at this time.  She does also states that she possibly has retained tampon applicator menstrual cycle 3 weeks ago.  She denies any pelvic pain, abdominal pain, vaginal discharge.  Pelvic exam without any evidence of retained foreign object, visualized or palpated.  No discharge.  No CMT or adnexal tenderness.  Labs were obtained which does not show evidence of leukocytosis, electrolyte abnormalities.  CK 225.  Have low suspicion for rhabdomyolysis.  Reassuring exam I have low suspicion for DVT, compartment syndrome, acute fracture, dislocation, myositis, septic joint.  Discussed anti-inflammatories, heat, muscle relaxers and close follow-up with PCP.  She is agreeable for this.  The patient has been appropriately medically screened and/or stabilized in the ED. I have low suspicion for any other emergent medical condition which would require further screening, evaluation or treatment in the ED or require inpatient management.  Patient is hemodynamically stable and in no acute distress.  Patient able to ambulate in department prior to ED.  Evaluation does not show acute pathology that would require ongoing or additional emergent interventions while in the emergency department or further inpatient treatment.  I have discussed the diagnosis with the patient and answered all questions.  Pain  is been managed while in the emergency department and patient has no further complaints prior to discharge.  Patient is comfortable with plan discussed in room and is stable for discharge at this time.  I have discussed strict return precautions for returning to the emergency department.  Patient was encouraged to follow-up with PCP/specialist refer to at discharge.    MDM Rules/Calculators/A&P                           Final Clinical Impression(s) / ED Diagnoses Final diagnoses:  Pain of left lower extremity    Rx / DC Orders ED Discharge Orders         Ordered    naproxen (NAPROSYN) 500 MG tablet  2 times daily        10/17/20 1602    methocarbamol (ROBAXIN) 500 MG tablet  2 times daily        10/17/20 1602           Lavalle Skoda A, PA-C 10/17/20 1623    Charlesetta Shanks, MD 10/18/20 1111

## 2020-10-17 NOTE — ED Triage Notes (Signed)
Patient has tightness in her left calf since 10/07/20, the pain woke her up in the middle of the night. She has walked,stretched, used compression socks and a bath soak. Went to UC and had an elevated d-dimer and recommended an [place]. Denies. Patient also states she inserted a tampon a few weeks ago, she has since removed the tampon but thinks the plastic applicator might still be in her vagina. She has manually searched w/o success. Endorses anxiety.

## 2020-10-17 NOTE — ED Notes (Signed)
An After Visit Summary was printed and given to the patient. Discharge instructions given and no further questions at this time.  

## 2020-10-17 NOTE — Progress Notes (Signed)
Lower extremity venous has been completed.   Preliminary results in CV Proc.   Abram Sander 10/17/2020 1:02 PM

## 2020-10-17 NOTE — Discharge Instructions (Signed)
Take the medications as prescribed.  The Robaxin is a muscle relaxer may make you sleepy.  Your labs and imaging were reassuring  Return for any worsening symptoms.

## 2020-10-29 ENCOUNTER — Ambulatory Visit (HOSPITAL_COMMUNITY): Payer: BC Managed Care – PPO

## 2020-11-14 ENCOUNTER — Encounter (HOSPITAL_COMMUNITY)
Admission: RE | Admit: 2020-11-14 | Discharge: 2020-11-14 | Disposition: A | Discharge status: Home / Self Care | Payer: BC Managed Care – PPO | Source: Ambulatory Visit | Attending: Gastroenterology | Admitting: Gastroenterology

## 2020-11-14 ENCOUNTER — Other Ambulatory Visit: Payer: Self-pay

## 2020-11-14 DIAGNOSIS — K5900 Constipation, unspecified: Secondary | ICD-10-CM | POA: Diagnosis present

## 2020-11-14 DIAGNOSIS — R1012 Left upper quadrant pain: Secondary | ICD-10-CM | POA: Diagnosis present

## 2020-11-14 MED ORDER — TECHNETIUM TC 99M MEBROFENIN IV KIT
5.3000 | PACK | Freq: Once | INTRAVENOUS | Status: AC | PRN
  Administered 2020-11-14: 5.3 via INTRAVENOUS

## 2020-12-04 ENCOUNTER — Encounter: Payer: Self-pay | Admitting: Gastroenterology

## 2020-12-07 ENCOUNTER — Encounter: Payer: Self-pay | Admitting: Gastroenterology

## 2020-12-07 ENCOUNTER — Ambulatory Visit (AMBULATORY_SURGERY_CENTER): Payer: BC Managed Care – PPO | Admitting: Gastroenterology

## 2020-12-07 ENCOUNTER — Other Ambulatory Visit: Payer: Self-pay

## 2020-12-07 VITALS — BP 121/52 | HR 76 | Temp 97.3°F | Resp 16 | Ht 62.0 in | Wt 241.0 lb

## 2020-12-07 DIAGNOSIS — K5900 Constipation, unspecified: Secondary | ICD-10-CM

## 2020-12-07 DIAGNOSIS — Z1211 Encounter for screening for malignant neoplasm of colon: Secondary | ICD-10-CM

## 2020-12-07 DIAGNOSIS — D120 Benign neoplasm of cecum: Secondary | ICD-10-CM

## 2020-12-07 DIAGNOSIS — K2950 Unspecified chronic gastritis without bleeding: Secondary | ICD-10-CM

## 2020-12-07 DIAGNOSIS — R1012 Left upper quadrant pain: Secondary | ICD-10-CM

## 2020-12-07 DIAGNOSIS — K2970 Gastritis, unspecified, without bleeding: Secondary | ICD-10-CM | POA: Diagnosis not present

## 2020-12-07 DIAGNOSIS — D125 Benign neoplasm of sigmoid colon: Secondary | ICD-10-CM

## 2020-12-07 MED ORDER — SODIUM CHLORIDE 0.9 % IV SOLN: 500.0000 mL | Freq: Once | INTRAVENOUS | Status: DC

## 2020-12-07 NOTE — Progress Notes (Signed)
Called to room to assist during endoscopic procedure.  Patient ID and intended procedure confirmed with present staff. Received instructions for my participation in the procedure from the performing physician.  

## 2020-12-07 NOTE — Progress Notes (Signed)
VS-NS 

## 2020-12-07 NOTE — Op Note (Signed)
[facility] Patient Name: Rhonda Young Procedure Date: 12/07/2020 9:07 AM MRN: [id number] Endoscopist: Thornton Park MD, MD Age: 50 Referring MD:  Date of Birth: 1971/04/28 Gender: Female Account #: [id number] Procedure:                Colonoscopy Indications:              Screening for colorectal malignant neoplasm, This                            is the patient's first colonoscopy                           No known family history of colon cancer or polyps Medicines:                Monitored Anesthesia Care Procedure:                Pre-Anesthesia Assessment:                           - Prior to the procedure, a History and Physical                            was performed, and patient medications and                            allergies were reviewed. The patient's tolerance of                            previous anesthesia was also reviewed. The risks                            and benefits of the procedure and the sedation                            options and risks were discussed with the patient.                            All questions were answered, and informed consent                            was obtained. Prior Anticoagulants: The patient has                            taken no previous anticoagulant or antiplatelet                            agents. ASA Grade Assessment: III - A patient with                            severe systemic disease. After reviewing the risks                            and benefits, the patient was deemed in  satisfactory condition to undergo the procedure.                           After obtaining informed consent, the colonoscope                            was passed under direct vision. Throughout the                            procedure, the patient's blood pressure, pulse, and                            oxygen saturations were monitored continuously. The                            Olympus [id number]  [id number]) [id number] was introduced                            through the anus and advanced to the 3 cm into the                            ileum. The colonoscopy was performed without                            difficulty. The patient tolerated the procedure                            well. The quality of the bowel preparation was                            good. The terminal ileum, ileocecal valve,                            appendiceal orifice, and rectum were photographed. Scope In: 9:23:55 AM Scope Out: 9:35:54 AM Scope Withdrawal Time: 0 hours 9 minutes 59 seconds  Total Procedure Duration: 0 hours 11 minutes 59 seconds  Findings:                 A 1 mm polyp was found in the sigmoid colon. The                            polyp was sessile. The polyp was removed with a                            cold snare. Resection and retrieval were complete.                            Estimated blood loss was minimal.                           A 1 mm polyp was found in the cecum. The polyp was  sessile. The polyp was removed with a cold snare.                            Resection and retrieval were complete. Estimated                            blood loss was minimal.                           Non-bleeding external and internal hemorrhoids were                            found. Complications:            No immediate complications. Estimated blood loss:                            Minimal. Estimated Blood Loss:     Estimated blood loss was minimal. Impression:               - One 1 mm polyp in the sigmoid colon, removed with                            a cold snare. Resected and retrieved.                           - One 1 mm polyp in the cecum, removed with a cold                            snare. Resected and retrieved.                           - The examination was otherwise normal on direct                            and retroflexion views. Recommendation:           -  Patient has a contact number available for                            emergencies. The signs and symptoms of potential                            delayed complications were discussed with the                            patient. Return to normal activities tomorrow.                            Written discharge instructions were provided to the                            patient.                           - Resume previous diet.                           -  Continue present medications.                           - Await pathology results.                           - Repeat colonoscopy date to be determined after                            pending pathology results are reviewed for                            surveillance.                           - Emerging evidence supports eating a diet of                            fruits, vegetables, grains, calcium, and yogurt                            while reducing red meat and alcohol may reduce the                            risk of colon cancer.                           - Thank you for allowing me to be involved in your                            colon cancer prevention. Thornton Park MD, MD 12/07/2020 9:42:32 AM This report has been signed electronically.

## 2020-12-07 NOTE — Op Note (Signed)
[place] Patient Name: Rhonda Young Procedure Date: 12/07/2020 9:07 AM MRN: [id number] Endoscopist: Thornton Park MD, MD Age: 50 Referring MD:  Date of Birth: 11/18/1970 Gender: Female Account #: [id number] Procedure:                Upper GI endoscopy Indications:              LUQ and epigastric abdominal pain x years                           - normal ultrasound 08/05/18 Medicines:                Monitored Anesthesia Care Procedure:                Pre-Anesthesia Assessment:                           - Prior to the procedure, a History and Physical                            was performed, and patient medications and                            allergies were reviewed. The patient's tolerance of                            previous anesthesia was also reviewed. The risks                            and benefits of the procedure and the sedation                            options and risks were discussed with the patient.                            All questions were answered, and informed consent                            was obtained. Prior Anticoagulants: The patient has                            taken no previous anticoagulant or antiplatelet                            agents. ASA Grade Assessment: III - A patient with                            severe systemic disease. After reviewing the risks                            and benefits, the patient was deemed in                            satisfactory condition to undergo the procedure.  After obtaining informed consent, the endoscope was                            passed under direct vision. Throughout the                            procedure, the patient's blood pressure, pulse, and                            oxygen saturations were monitored continuously. The                            Endoscope was introduced through the mouth, and                            advanced to the third part of  duodenum. The upper                            GI endoscopy was accomplished without difficulty.                            The patient tolerated the procedure well. Scope In: Scope Out: Findings:                 The examined esophagus was normal.                           Diffuse mild inflammation characterized by                            erythema, friability and granularity was found in                            the gastric body. Biopsies were taken from the                            antrum, body, and fundus with a cold forceps for                            histology. Estimated blood loss was minimal.                           The examined duodenum was normal. Biopsies were                            taken with a cold forceps for histology. Estimated                            blood loss was minimal. Complications:            No immediate complications. Estimated blood loss:                            Minimal. Estimated Blood Loss:     Estimated blood loss was minimal. Impression:               -  Normal esophagus.                           - Gastritis. Biopsied.                           - Normal examined duodenum. Biopsied. Recommendation:           - Patient has a contact number available for                            emergencies. The signs and symptoms of potential                            delayed complications were discussed with the                            patient. Return to normal activities tomorrow.                            Written discharge instructions were provided to the                            patient.                           - Resume previous diet.                           - Continue present medications.                           - No aspirin, ibuprofen, naproxen, or other                            non-steroidal anti-inflammatory drugs.                           - Proceed with colonoscopy as previously planned. Thornton Park MD, MD 12/07/2020  9:38:46 AM This report has been signed electronically.

## 2020-12-07 NOTE — Progress Notes (Signed)
No problems noted in the recovery room. maw 

## 2020-12-07 NOTE — Patient Instructions (Addendum)
Handouts were given to you on polyps, hemorrhoids, and gastritis. NO ASPIRIN, ASPIRIN CONTAINING PRODUCTS (BC OR GOODY POWDERS) OR NSAIDS (IBUPROFEN, ADVIL, ALEVE, AND MOTRIN); TYLENOL IS OK TO TAKE. You may resume your current medications today. Await biopsy results.  May take 1-3 weeks to receive pathology results. Please call if any questions or concerns.    YOU HAD AN ENDOSCOPIC PROCEDURE TODAY AT [facility]:   Refer to the procedure report that was given to you for any specific questions about what was found during the examination.  If the procedure report does not answer your questions, please call your gastroenterologist to clarify.  If you requested that your care partner not be given the details of your procedure findings, then the procedure report has been included in a sealed envelope for you to review at your convenience later.  YOU SHOULD EXPECT: Some feelings of bloating in the abdomen. Passage of more gas than usual.  Walking can help get rid of the air that was put into your GI tract during the procedure and reduce the bloating. If you had a lower endoscopy (such as a colonoscopy or flexible sigmoidoscopy) you may notice spotting of blood in your stool or on the toilet paper. If you underwent a bowel prep for your procedure, you may not have a normal bowel movement for a few days.  Please Note:  You might notice some irritation and congestion in your nose or some drainage.  This is from the oxygen used during your procedure.  There is no need for concern and it should clear up in a day or so.  SYMPTOMS TO REPORT IMMEDIATELY:   Following lower endoscopy (colonoscopy or flexible sigmoidoscopy):  Excessive amounts of blood in the stool  Significant tenderness or worsening of abdominal pains  Swelling of the abdomen that is new, acute  Fever of 100F or higher   Following upper endoscopy (EGD)  Vomiting of blood or coffee ground material  New chest pain or pain  under the shoulder blades  Painful or persistently difficult swallowing  New shortness of breath  Fever of 100F or higher  Black, tarry-looking stools  For urgent or emergent issues, a gastroenterologist can be reached at any hour by calling [phone]. Do not use MyChart messaging for urgent concerns.    DIET:  We do recommend a small meal at first, but then you may proceed to your regular diet.  Drink plenty of fluids but you should avoid alcoholic beverages for 24 hours.  ACTIVITY:  You should plan to take it easy for the rest of today and you should NOT DRIVE or use heavy machinery until tomorrow (because of the sedation medicines used during the test).    FOLLOW UP: Our staff will call the number listed on your records 48-72 hours following your procedure to check on you and address any questions or concerns that you may have regarding the information given to you following your procedure. If we do not reach you, we will leave a message.  We will attempt to reach you two times.  During this call, we will ask if you have developed any symptoms of COVID 19. If you develop any symptoms (ie: fever, flu-like symptoms, shortness of breath, cough etc.) before then, please call [phone].  If you test positive for Covid 19 in the 2 weeks post procedure, please call and report this information to [place].    If any biopsies were taken you will be contacted by phone  or by letter within the next 1-3 weeks.  Please call us at [phone] if you have not heard about the biopsies in 3 weeks.    SIGNATURES/CONFIDENTIALITY: You and/or your care partner have signed paperwork which will be entered into your electronic medical record.  These signatures attest to the fact that that the information above on your After Visit Summary has been reviewed and is understood.  Full responsibility of the confidentiality of this discharge information lies with you and/or your care-partner.

## 2020-12-07 NOTE — Progress Notes (Signed)
To PACU, VSS. Report to [email or web address] 

## 2020-12-11 ENCOUNTER — Telehealth: Payer: Self-pay

## 2020-12-11 NOTE — Telephone Encounter (Signed)
  Follow up Call-  Call back number 12/07/2020  Post procedure Call Back phone  # [phone]  Permission to leave phone message Yes  Some recent data might be hidden     Patient questions:  Do you have a fever, pain , or abdominal swelling? No. Pain Score  0 *  Have you tolerated food without any problems? Yes.    Have you been able to return to your normal activities? Yes.    Do you have any questions about your discharge instructions: Diet   No. Medications  No. Follow up visit  No.  Do you have questions or concerns about your Care? No.  Actions: * If pain score is 4 or above: No action needed, pain <4.  Pt reported she was having "sinus" since her procedure.  I advised her that she did have oxygen via nasal canula.  It possibly could dry out her nasal cavity.  To try using nasal saline spray and hydrate her self well.  If continues she may want to contact her primary care MD. Maw  1. Have you developed a fever since your procedure? no  2.   Have you had an respiratory symptoms (SOB or cough) since your procedure? no  3.   Have you tested positive for COVID 19 since your procedure no  4.   Have you had any family members/close contacts diagnosed with the COVID 19 since your procedure?  no   If yes to any of these questions please route to Joylene John, RN and Joella Prince, RN

## 2020-12-19 ENCOUNTER — Encounter: Payer: Self-pay | Admitting: Gastroenterology

## 2020-12-20 ENCOUNTER — Ambulatory Visit: Payer: BC Managed Care – PPO | Admitting: Dietician

## 2020-12-26 ENCOUNTER — Encounter: Payer: Self-pay | Admitting: Gastroenterology

## 2021-03-25 IMAGING — US US ABDOMEN LIMITED
1 series · 14 of 25 positions shown · non-contrast
Comparison: Abdominal ultrasound 08/05/2018. Abdominal CT
01/02/2016

CLINICAL DATA: Right upper quadrant pain.

EXAM:
ULTRASOUND ABDOMEN LIMITED RIGHT UPPER QUADRANT

[Series 1: us abdomen limited · 14 of 63 slices shown]
[im 1/63]
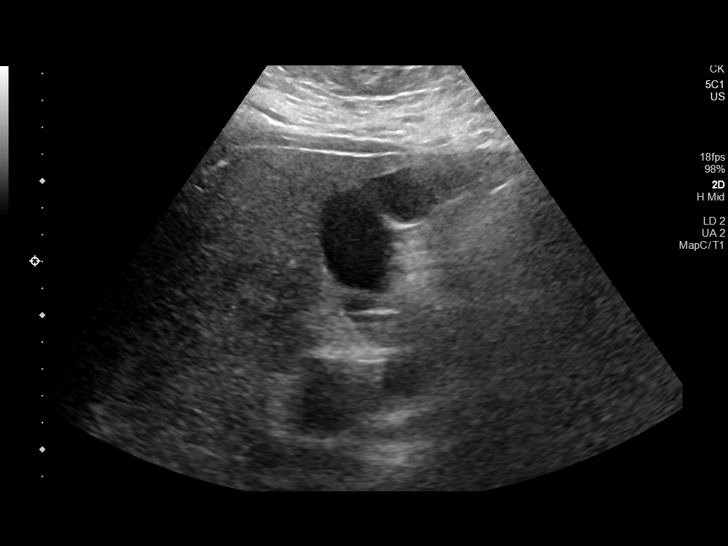
[im 6/63]
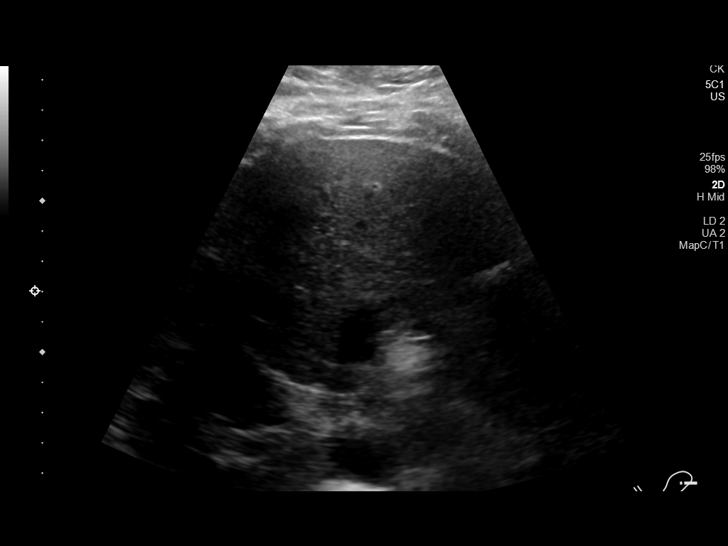
[im 11/63]
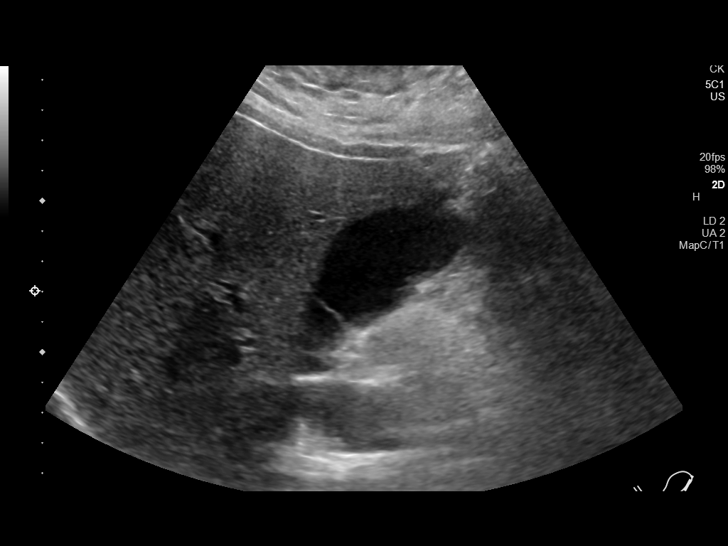
[im 16/63]
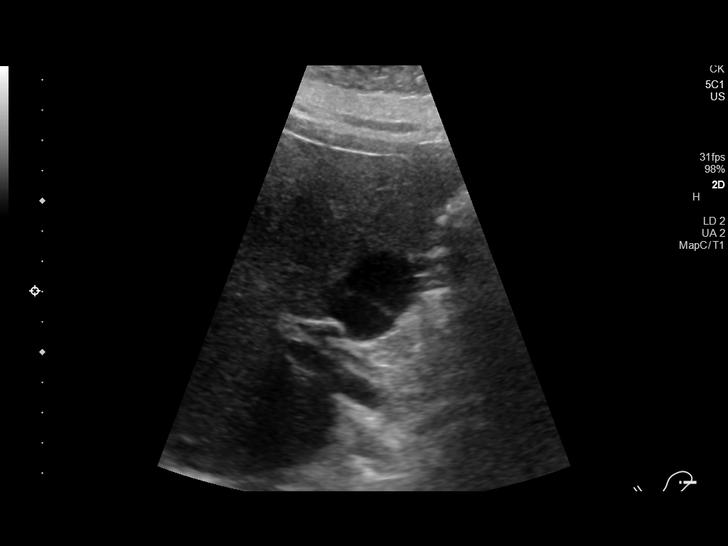
[im 21/63]
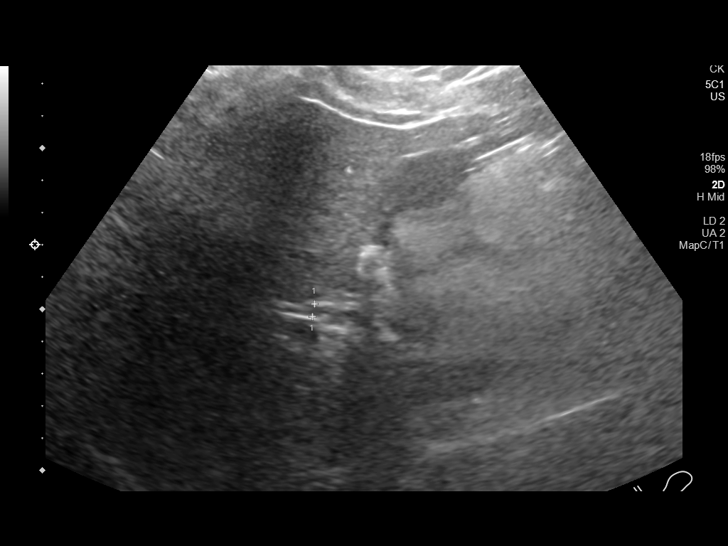
[im 24/63]
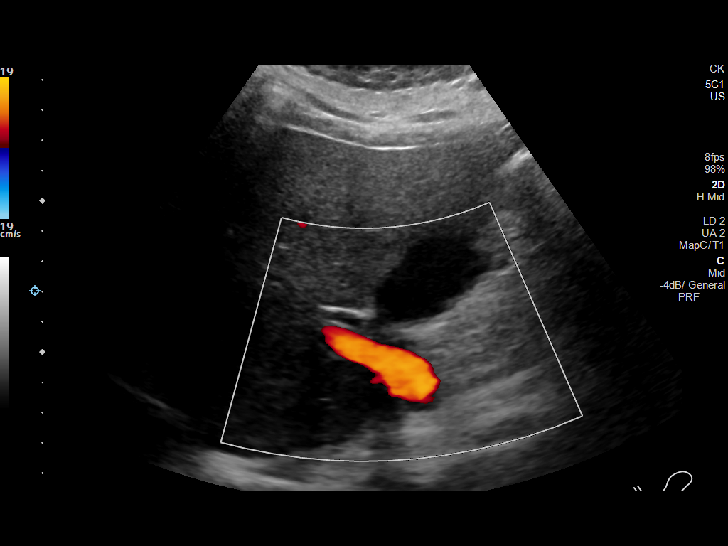
[im 29/63]
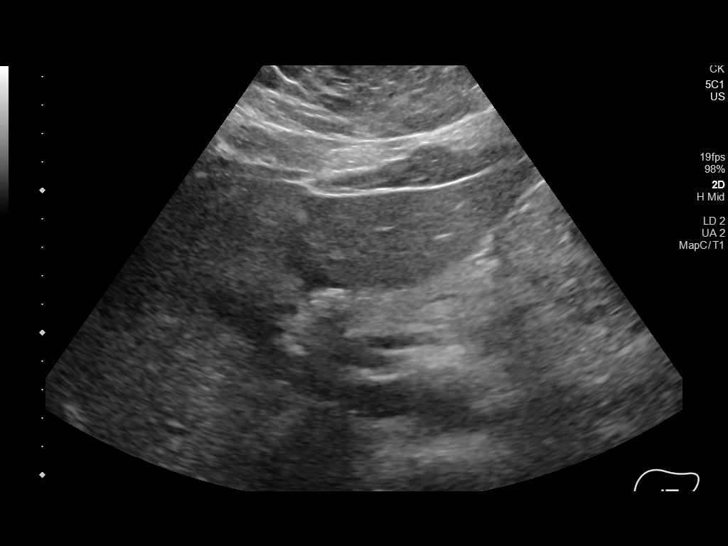
[im 34/63]
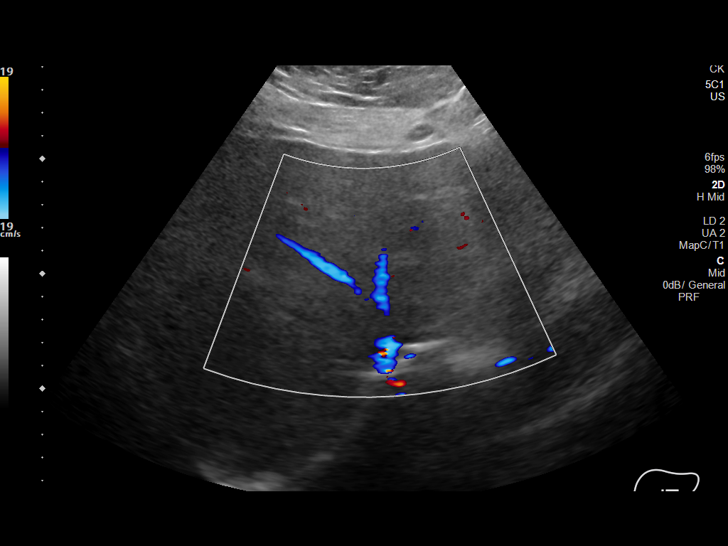
[im 39/63]
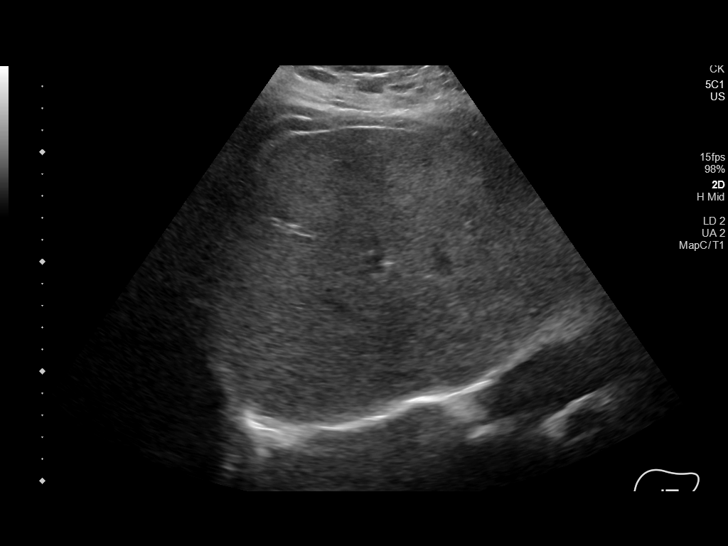
[im 42/63]
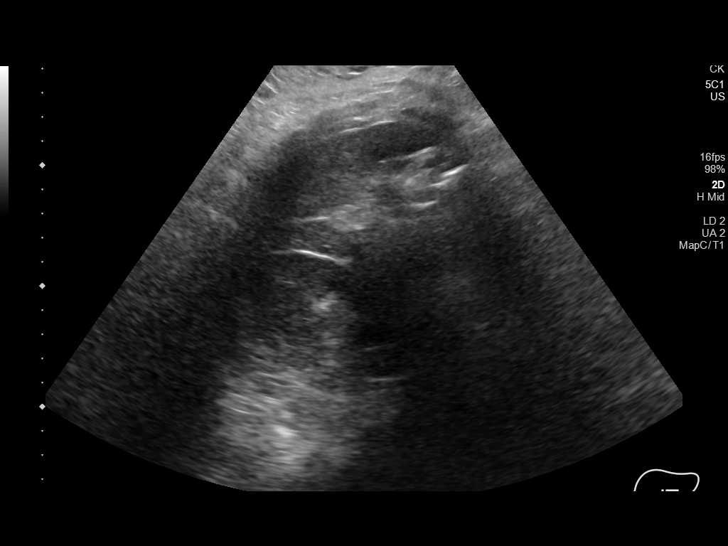
[im 47/63]
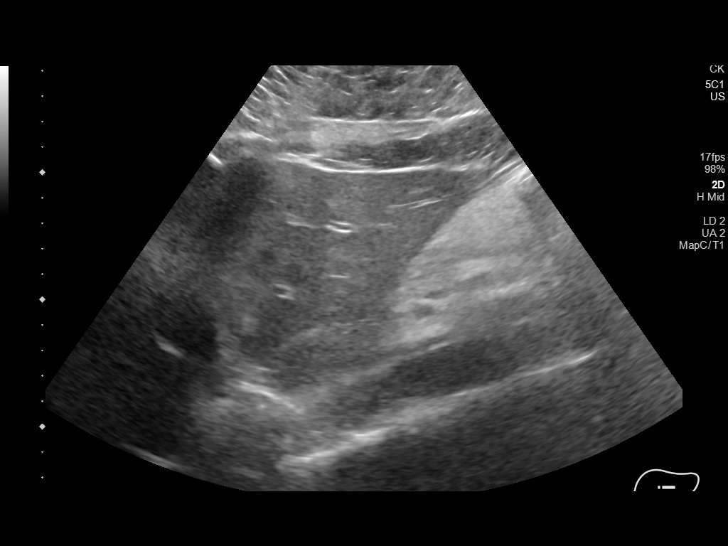
[im 52/63]
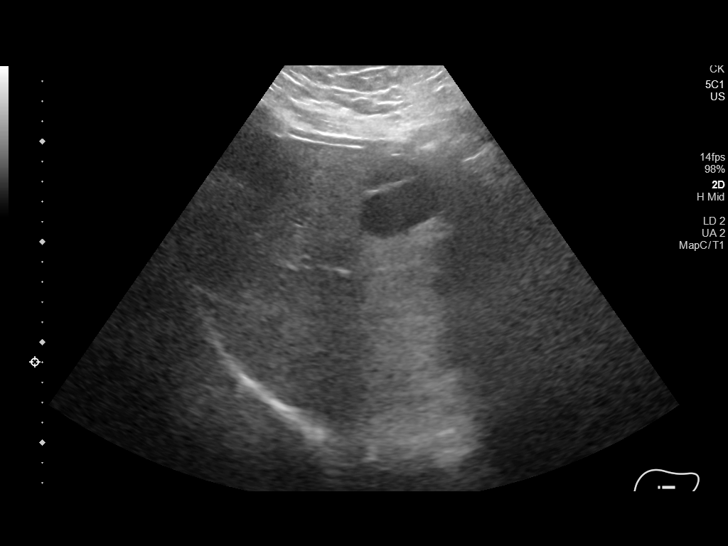
[im 57/63]
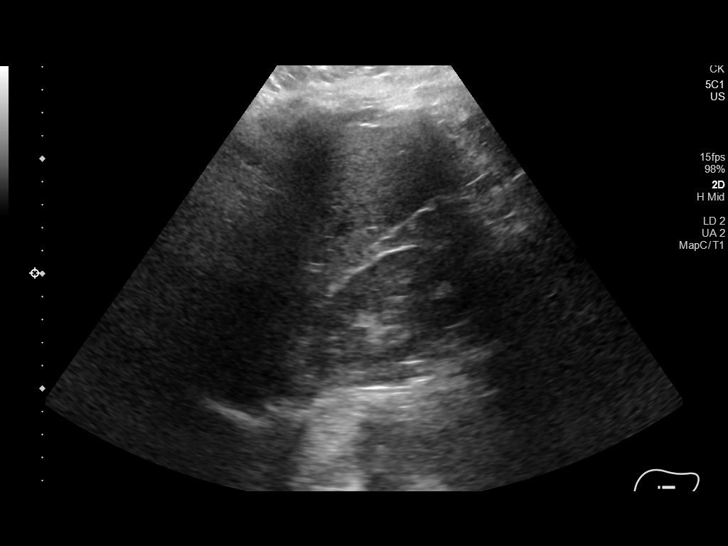
[im 63/63]
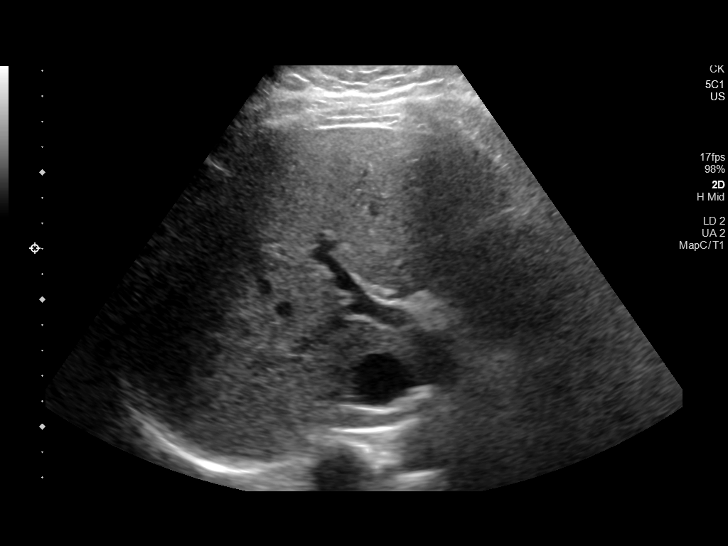

[14 of 25 positions shown; findings below may reference images not displayed]

FINDINGS: Gallbladder:

Physiologically distended with multiple folds. No gallstones or wall
thickening visualized. No sonographic Murphy sign noted by
sonographer.

Common bile duct:

Diameter: 4 mm, normal.

Liver:

No focal lesion identified. Heterogeneous and slightly increased
compared to right kidney in parenchymal echogenicity. Portal vein is
patent on color Doppler imaging with normal direction of blood flow
towards the liver.

Other: Hypodense 2.9 cm right suprarenal mass abutting the right
lobe of the liver corresponds to known adenoma, unchanged.
IMPRESSION: 1. Normal sonographic appearance of gallbladder and biliary tree.
2. Mild hepatic steatosis.
3. Known right adrenal adenoma is grossly stable sonographically.

## 2021-03-26 ENCOUNTER — Other Ambulatory Visit: Payer: Self-pay

## 2021-04-08 ENCOUNTER — Other Ambulatory Visit: Payer: Self-pay

## 2021-04-08 ENCOUNTER — Emergency Department (HOSPITAL_COMMUNITY)
Admission: EM | Admit: 2021-04-08 | Discharge: 2021-04-08 | Disposition: A | Discharge status: Home / Self Care | Payer: BC Managed Care – PPO | Attending: Emergency Medicine | Admitting: Emergency Medicine

## 2021-04-08 ENCOUNTER — Encounter (HOSPITAL_COMMUNITY): Payer: Self-pay

## 2021-04-08 DIAGNOSIS — Z79899 Other long term (current) drug therapy: Secondary | ICD-10-CM | POA: Diagnosis not present

## 2021-04-08 DIAGNOSIS — M542 Cervicalgia: Secondary | ICD-10-CM | POA: Diagnosis present

## 2021-04-08 DIAGNOSIS — R202 Paresthesia of skin: Secondary | ICD-10-CM | POA: Insufficient documentation

## 2021-04-08 DIAGNOSIS — Z7982 Long term (current) use of aspirin: Secondary | ICD-10-CM | POA: Diagnosis not present

## 2021-04-08 DIAGNOSIS — M5412 Radiculopathy, cervical region: Secondary | ICD-10-CM | POA: Diagnosis not present

## 2021-04-08 DIAGNOSIS — I10 Essential (primary) hypertension: Secondary | ICD-10-CM | POA: Diagnosis not present

## 2021-04-08 MED ORDER — KETOROLAC TROMETHAMINE 60 MG/2ML IM SOLN
60.0000 mg | Freq: Once | INTRAMUSCULAR | Status: AC
  Administered 2021-04-08: 60 mg via INTRAMUSCULAR
  Filled 2021-04-08: qty 2

## 2021-04-08 MED ORDER — PREDNISONE 10 MG (21) PO TBPK: ORAL_TABLET | ORAL | 0 refills | Status: DC

## 2021-04-08 NOTE — ED Provider Notes (Signed)
[facility] Provider Note  CSN: [id number] Arrival date & time: 04/08/21 1723    History Chief Complaint  Patient presents with   Neck Pain     Neck Pain  Rhonda Young is a 50 y.o. female presents for evaluation of R neck pain ongoing for about 3 weeks since she had a mammogram in late May. She reports the pain is a tightness in R neck and shoulder, worse with certain movements and radiating down her R arm. She has had some tingling and aching in her R hand. She went to UC about a week ago, was given Rx for Meloxicam and Flexeril and sent for Physical Therapy which she has had one session of so far. She has been able to sleep better but the pain has not improved much.    Past Medical History:  Diagnosis Date   Adrenal adenoma    R side; Gets serial [place] yearly   Allergy    Anxiety    Borderline diabetes    diet controlled   Depression    GERD (gastroesophageal reflux disease)    H/O exercise stress test    ETT 3/17: No ST changes   Hypertension    Iron deficiency anemia    2/2 menstrual cycles   Menorrhagia    Obesity     Past Surgical History:  Procedure Laterality Date   bilateral tubal ligation  1996   COLONOSCOPY     head surgery  1980   50yo   NASAL SINUS SURGERY  2013   UPPER GASTROINTESTINAL ENDOSCOPY      Family History  Problem Relation Age of Onset   Hypertension Mother    Hemachromatosis Mother    Liver cancer Mother    Hypertension Father    Diabetes Brother    Other Son        killed by a drunk driver   Heart attack Neg Hx    Colon cancer Neg Hx    Esophageal cancer Neg Hx    Rectal cancer Neg Hx    Stomach cancer Neg Hx     Social History   Tobacco Use   Smoking status: Never   Smokeless tobacco: Never  Vaping Use   Vaping Use: Never used  Substance Use Topics   Alcohol use: Yes    Comment: rare   Drug use: No     Home Medications Prior to Admission medications   Medication Sig Start Date End Date  Taking? Authorizing Provider  predniSONE (STERAPRED UNI-PAK 21 TAB) 10 MG (21) TBPK tablet 10mg  Tabs, 6 day taper. Use as directed 04/08/21  Yes Truddie Hidden, MD  Aspirin-Salicylamide-Caffeine [facility] HEADACHE POWDER PO) Take 1 packet by mouth daily as needed (headache & pain). For pain.    [provider]  baclofen (LIORESAL) 10 MG tablet Take 10 mg by mouth at bedtime. 11/10/20   [provider]  Cholecalciferol (VITAMIN D3) 1.25 MG (50000 UT) CAPS Take 1 capsule by mouth once a week.    [provider]  FeFum-FePoly-FA-B Cmp-C-Biot (INTEGRA PLUS) CAPS Take 1 capsule by mouth daily. 11/27/15   [provider]  lactulose (CHRONULAC) 10 GM/15ML solution SMARTSIG:Milliliter(s) By Mouth 09/28/20   [provider]  LINZESS 290 MCG CAPS capsule Take 290 mcg by mouth daily. 08/17/20   [provider]  lisinopril-hydrochlorothiazide (PRINZIDE,ZESTORETIC) 10-12.5 MG tablet Take 1 tablet by mouth daily.    [provider]  methocarbamol (ROBAXIN) 500 MG tablet Take 1  tablet (500 mg total) by mouth 2 (two) times daily. 10/17/20   Henderly, Britni A, PA-C  Multiple Vitamins-Minerals (MULTIVITAMIN GUMMIES ADULT) CHEW Chew 1 tablet by mouth at bedtime.     [provider]  naproxen (NAPROSYN) 500 MG tablet Take 1 tablet (500 mg total) by mouth 2 (two) times daily. 10/17/20   Henderly, Britni A, PA-C     Allergies    Patient has no known allergies.   Review of Systems   Review of Systems  Musculoskeletal:  Positive for neck pain.  A comprehensive review of systems was completed and negative except as noted in HPI.    Physical Exam BP (!) 153/94   Pulse 90   Temp 99.3 F (37.4 C) (Oral)   Resp 18   SpO2 96%   Physical Exam Vitals and nursing note reviewed.  HENT:     Head: Normocephalic.     Nose: Nose normal.  Eyes:     Extraocular Movements: Extraocular movements intact.  Pulmonary:     Effort: Pulmonary effort is  normal.  Musculoskeletal:        General: Tenderness (R cervical and trapezius muscles) present. Normal range of motion.     Cervical back: Neck supple.  Skin:    Findings: No rash (on exposed skin).  Neurological:     Mental Status: She is alert and oriented to person, place, and time.     Cranial Nerves: No cranial nerve deficit.     Sensory: No sensory deficit.     Motor: No weakness.  Psychiatric:        Mood and Affect: Mood normal.     ED Results / Procedures / Treatments   Labs (all labs ordered are listed, but only abnormal results are displayed) Labs Reviewed - No data to display  EKG None  Radiology No results found.  Procedures Procedures  Medications Ordered in the ED Medications  ketorolac (TORADOL) injection 60 mg (has no administration in time range)     MDM Rules/Calculators/A&P MDM  Patient with symptoms consistent with cervical radiculopathy. Already on NSAID and muscle relaxer. Will give a trial of prednisone and refer to Spine for further evaluation if not improved. Toradol in the ED for symptomatic care.  ED Course  I have reviewed the triage vital signs and the nursing notes.  Pertinent labs & imaging results that were available during my care of the patient were reviewed by me and considered in my medical decision making (see chart for details).     Final Clinical Impression(s) / ED Diagnoses Final diagnoses:  Cervical radiculopathy    Rx / DC Orders ED Discharge Orders          Ordered    predniSONE (STERAPRED UNI-PAK 21 TAB) 10 MG (21) TBPK tablet        04/08/21 2119             Truddie Hidden, MD 04/08/21 2120

## 2021-04-08 NOTE — ED Triage Notes (Signed)
Pt c/o neck pain, pain also radiates down to right arm at times. Pt states it started May 26. Pt had back pain and right shoulder pain after her mammogram May 26 and believes she may have pinched a nerve.

## 2021-04-24 ENCOUNTER — Ambulatory Visit: Payer: BC Managed Care – PPO | Admitting: Cardiology

## 2021-04-24 ENCOUNTER — Encounter: Payer: Self-pay | Admitting: Cardiology

## 2021-04-24 ENCOUNTER — Other Ambulatory Visit: Payer: Self-pay

## 2021-04-24 VITALS — BP 134/85 | HR 106 | Temp 98.1°F | Resp 17 | Ht 62.0 in | Wt 229.2 lb

## 2021-04-24 DIAGNOSIS — R011 Cardiac murmur, unspecified: Secondary | ICD-10-CM

## 2021-04-24 DIAGNOSIS — I10 Essential (primary) hypertension: Secondary | ICD-10-CM

## 2021-04-24 DIAGNOSIS — M5412 Radiculopathy, cervical region: Secondary | ICD-10-CM

## 2021-04-24 NOTE — Progress Notes (Signed)
Primary Physician/Referring:  [place], [place] @ [facility]  Patient ID: MAEOLA MCHANEY, female    DOB: [birth year], 50 y.o.   MRN: [id number]  Chief Complaint  Patient presents with   svt   New Patient (Initial Visit)    Ref by Fabian Sharp, PA   HPI:    ZEYNA MKRTCHYAN  is a 50 y.o. African-American female patient with prediabetes, GERD, primary hypertension, right supravaginal adrenal adenoma, asymptomatic referred to me for evaluation of chest pain and right arm pain.  Since end of May 2022, she suddenly started having severe pain in her upper chest on the right and also radiation along the right arm.  She is also seen in the emergency room and felt the symptoms are related to cervical radiculopathy.  She was concerned about cardiac issues in view of obesity and sedentary lifestyle and wanted to get a second opinion.  Although sedentary, she denies any chest pain or shortness of breath except pain that is constant all the way from her neck radiating down to the front of the chest and to her right arm along with tingling and numbness of her right arm.  Past Medical History:  Diagnosis Date   Adrenal adenoma    R side; Gets serial [place] yearly   Allergy    Anxiety    Borderline diabetes    diet controlled   Depression    GERD (gastroesophageal reflux disease)    H/O exercise stress test    ETT 3/17: No ST changes   Hypertension    Iron deficiency anemia    2/2 menstrual cycles   Menorrhagia    Obesity    Past Surgical History:  Procedure Laterality Date   bilateral tubal ligation  1996   COLONOSCOPY     head surgery  1980   50yo   NASAL SINUS SURGERY  2013   UPPER GASTROINTESTINAL ENDOSCOPY     Family History  Problem Relation Age of Onset   Hypertension Mother    Hemachromatosis Mother    Liver cancer Mother    Hypertension Father    Diabetes Brother    Kidney disease Brother    Kidney disease Brother    Other Son        killed by a drunk driver    Heart attack Neg Hx    Colon cancer Neg Hx    Esophageal cancer Neg Hx    Rectal cancer Neg Hx    Stomach cancer Neg Hx     Social History   Tobacco Use   Smoking status: Never   Smokeless tobacco: Never  Substance Use Topics   Alcohol use: Yes    Comment: rare   Marital Status: Single  ROS  Review of Systems  Cardiovascular:  Positive for chest pain. Negative for dyspnea on exertion and leg swelling.  Gastrointestinal:  Negative for melena.  Neurological:  Positive for paresthesias.  Objective  Blood pressure 134/85, pulse (!) 106, temperature 98.1 F (36.7 C), temperature source Temporal, resp. rate 17, height 5\' 2"  (1.575 m), weight 229 lb 3.2 oz (104 kg), SpO2 96 %. Body mass index is 41.92 kg/m.  Vitals with BMI 04/24/2021 04/08/2021 04/08/2021  Height 5\' 2"  - -  Weight 229 lbs 3 oz - -  BMI 78.24 - -  Systolic 235 361 -  Diastolic 85 443 -  Pulse 154 91 90     Physical Exam Constitutional:      Appearance: She is obese.  HENT:     Head: Atraumatic.  Neck:     Vascular: No carotid bruit or JVD.  Cardiovascular:     Rate and Rhythm: Normal rate and regular rhythm.     Pulses: Intact distal pulses.     Heart sounds: S1 normal and S2 normal. Murmur heard.  Systolic murmur is present with a grade of 2/6 at the upper right sternal border and apex.    No gallop.  Pulmonary:     Effort: Pulmonary effort is normal.     Breath sounds: Normal breath sounds.  Abdominal:     General: Bowel sounds are normal.     Palpations: Abdomen is soft.     Laboratory examination:   Recent Labs    10/17/20 1440  NA 139  K 4.0  CL 106  CO2 22  GLUCOSE 92  BUN 11  CREATININE 0.97  CALCIUM 9.2  GFRNONAA >60   CrCl cannot be calculated (Patient's most recent lab result is older than the maximum 21 days allowed.).  CMP Latest Ref Rng & Units 10/17/2020 12/30/2019 05/05/2016  Glucose 70 - 99 mg/dL 92 106(H) 90  BUN 6 - 20 mg/dL 11 17 11   Creatinine 0.44 - 1.00 mg/dL 0.97  1.03(H) 0.86  Sodium 135 - 145 mmol/L 139 138 138  Potassium 3.5 - 5.1 mmol/L 4.0 3.5 3.6  Chloride 98 - 111 mmol/L 106 105 104  CO2 22 - 32 mmol/L 22 24 26   Calcium 8.9 - 10.3 mg/dL 9.2 9.2 9.1  Total Protein 6.5 - 8.1 g/dL - 7.8 7.9  Total Bilirubin 0.3 - 1.2 mg/dL - 0.4 0.5  Alkaline Phos 38 - 126 U/L - 107 118  AST 15 - 41 U/L - 24 20  ALT 0 - 44 U/L - 23 18   CBC Latest Ref Rng & Units 10/17/2020 12/30/2019 05/05/2016  WBC 4.0 - 10.5 K/uL 5.7 7.6 8.8  Hemoglobin 12.0 - 15.0 g/dL 11.2(L) 11.6(L) 12.4  Hematocrit 36.0 - 46.0 % 39.0 37.4 38.0  Platelets 150 - 400 K/uL 321 437(H) 430(H)    Lipid Panel No results for input(s): CHOL, TRIG, LDLCALC, VLDL, HDL, CHOLHDL, LDLDIRECT in the last 8760 hours. Lipid Panel  No results found for: CHOL, TRIG, HDL, CHOLHDL, VLDL, LDLCALC, LDLDIRECT, LABVLDL   HEMOGLOBIN A1C No results found for: HGBA1C, MPG TSH Recent Labs    10/12/20 1232  TSH 1.31   Medications and allergies  No Known Allergies    Medication prior to this encounter:   Outpatient Medications Prior to Visit  Medication Sig Dispense Refill   Aspirin-Salicylamide-Caffeine (BC HEADACHE POWDER PO) Take 1 packet by mouth daily as needed (headache & pain). For pain.     baclofen (LIORESAL) 10 MG tablet Take 10 mg by mouth as needed.     Cholecalciferol (VITAMIN D3) 1.25 MG (50000 UT) CAPS Take 1 capsule by mouth once a week.     cyclobenzaprine (FLEXERIL) 5 MG tablet Take 5 mg by mouth at bedtime as needed.     FeFum-FePoly-FA-B Cmp-C-Biot (INTEGRA PLUS) CAPS Take 1 capsule by mouth daily.  1   lactulose (CHRONULAC) 10 GM/15ML solution SMARTSIG:Milliliter(s) By Mouth     LINZESS 290 MCG CAPS capsule Take 290 mcg by mouth as needed.     lisinopril-hydrochlorothiazide (PRINZIDE,ZESTORETIC) 10-12.5 MG tablet Take 1 tablet by mouth daily.     meloxicam (MOBIC) 7.5 MG tablet Take 7.5 mg by mouth daily as needed.     methocarbamol (ROBAXIN) 500 MG tablet Take  1 tablet (500 mg  total) by mouth 2 (two) times daily. (Patient taking differently: Take 500 mg by mouth as needed.) 20 tablet 0   Multiple Vitamins-Minerals (MULTIVITAMIN GUMMIES ADULT) CHEW Chew 1 tablet by mouth at bedtime.      naproxen (NAPROSYN) 500 MG tablet Take 1 tablet (500 mg total) by mouth 2 (two) times daily. (Patient taking differently: Take 500 mg by mouth as needed.) 30 tablet 0   predniSONE (STERAPRED UNI-PAK 21 TAB) 10 MG (21) TBPK tablet 10mg  Tabs, 6 day taper. Use as directed 1 each 0   No facility-administered medications prior to visit.     FINAL MEDICATION AS OF TODAY:   Medications after current encounter Current Outpatient Medications  Medication Instructions   Aspirin-Salicylamide-Caffeine (BC HEADACHE POWDER PO) 1 packet, Oral, Daily PRN, For pain.    baclofen (LIORESAL) 10 mg, Oral, As needed   Cholecalciferol (VITAMIN D3) 1.25 MG (50000 UT) CAPS 1 capsule, Oral, Weekly   cyclobenzaprine (FLEXERIL) 5 mg, Oral, At bedtime PRN   FeFum-FePoly-FA-B Cmp-C-Biot (INTEGRA PLUS) CAPS 1 capsule, Oral, Daily   lactulose (CHRONULAC) 10 GM/15ML solution SMARTSIG:Milliliter(s) By Mouth   Linzess 290 mcg, Oral, As needed   lisinopril-hydrochlorothiazide (PRINZIDE,ZESTORETIC) 10-12.5 MG tablet 1 tablet, Oral, Daily   meloxicam (MOBIC) 7.5 mg, Oral, Daily PRN   methocarbamol (ROBAXIN) 500 mg, Oral, 2 times daily   Multiple Vitamins-Minerals (MULTIVITAMIN GUMMIES ADULT) CHEW 1 tablet, Oral, Daily at bedtime   naproxen (NAPROSYN) 500 mg, Oral, 2 times daily   Radiology:   No results found.  Cardiac Studies:   Treadmill exercise stress test 01/01/2016: Good exercise capacity, achieved 10.1 METS.  Mildly hypertensive at rest and normal blood pressure response with stress.  There was no ST-T wave changes of ischemia.  No significant arrhythmias noted.  EKG:   EKG 04/24/2021: Normal sinus rhythm at rate of 100 bpm, normal axis.  No evidence of ischemia, normal EKG.     Assessment      ICD-10-CM   1. Primary hypertension  I10 EKG 12-Lead    PCV ECHOCARDIOGRAM COMPLETE    2. Cervical radiculopathy  M54.12     3. Murmur  R01.1 PCV ECHOCARDIOGRAM COMPLETE     Medications Discontinued During This Encounter  Medication Reason   predniSONE (STERAPRED UNI-PAK 21 TAB) 10 MG (21) TBPK tablet Error    No orders of the defined types were placed in this encounter.  Orders Placed This Encounter  Procedures   EKG 12-Lead   PCV ECHOCARDIOGRAM COMPLETE    Standing Status:   Future    Standing Expiration Date:   04/24/2022   Recommendations:   EULALAH RUPERT is a 50 y.o. African-American female patient with prediabetes, GERD, primary hypertension, right supravaginal adrenal adenoma, asymptomatic referred to me for evaluation of chest pain and right arm pain that started end of May 2022.  Her physical examination except for obesity is normal, blood pressure is also well controlled.  Her symptoms are related to cervical radiculopathy and she already has an appointment to see spine specialist next week.  I encouraged her to keep the appointment, I reassured her that her symptoms are not cardiac related.  She does have a midsystolic murmur at the apex and right sternal border, it may be related to either mild MR or intraventricular pressure gradient from hyperdynamic LV.  We will obtain an echocardiogram.  She has had a routine treadmill stress test less than 5 years ago which was completely normal.  I encouraged  her to continue to exercise and lose weight, unless echocardiogram shows significant abnormality I will see her back on a as needed basis.  Evaluation for noncardiac chest pain is indicated.  She does need lipid profile testing for primary maintenance.    Adrian Prows, MD, [facility] 04/24/2021, 1:50 PM Office: [phone]

## 2021-05-08 ENCOUNTER — Other Ambulatory Visit: Payer: Self-pay | Admitting: Orthopaedic Surgery

## 2021-05-08 DIAGNOSIS — M5412 Radiculopathy, cervical region: Secondary | ICD-10-CM

## 2021-05-19 ENCOUNTER — Ambulatory Visit
Admission: RE | Admit: 2021-05-19 | Discharge: 2021-05-19 | Disposition: A | Discharge status: Home / Self Care | Payer: BC Managed Care – PPO | Source: Ambulatory Visit | Attending: Orthopaedic Surgery | Admitting: Orthopaedic Surgery

## 2021-05-19 ENCOUNTER — Other Ambulatory Visit: Payer: Self-pay

## 2021-05-19 DIAGNOSIS — M5412 Radiculopathy, cervical region: Secondary | ICD-10-CM

## 2021-05-21 ENCOUNTER — Other Ambulatory Visit: Payer: BC Managed Care – PPO

## 2021-05-28 ENCOUNTER — Ambulatory Visit: Payer: BC Managed Care – PPO

## 2021-05-28 ENCOUNTER — Other Ambulatory Visit: Payer: Self-pay

## 2021-05-28 DIAGNOSIS — I10 Essential (primary) hypertension: Secondary | ICD-10-CM

## 2021-05-28 DIAGNOSIS — R011 Cardiac murmur, unspecified: Secondary | ICD-10-CM

## 2021-07-09 ENCOUNTER — Telehealth: Payer: Self-pay | Admitting: Internal Medicine

## 2021-07-09 NOTE — Telephone Encounter (Signed)
Scheduled appt per 9/13 referral. Pt requested to see Dr. Julien Nordmann, scheduled for next available slot. Pt is aware of appt date and time.

## 2021-07-19 ENCOUNTER — Other Ambulatory Visit: Payer: Self-pay | Admitting: Internal Medicine

## 2021-07-19 DIAGNOSIS — D539 Nutritional anemia, unspecified: Secondary | ICD-10-CM

## 2021-07-22 ENCOUNTER — Other Ambulatory Visit: Payer: Self-pay

## 2021-07-22 ENCOUNTER — Inpatient Hospital Stay: Payer: BC Managed Care – PPO

## 2021-07-22 ENCOUNTER — Inpatient Hospital Stay: Payer: BC Managed Care – PPO | Attending: Internal Medicine | Admitting: Internal Medicine

## 2021-07-22 DIAGNOSIS — Z79899 Other long term (current) drug therapy: Secondary | ICD-10-CM | POA: Insufficient documentation

## 2021-07-22 DIAGNOSIS — Z800 Family history of malignant neoplasm of digestive organs: Secondary | ICD-10-CM | POA: Insufficient documentation

## 2021-07-22 DIAGNOSIS — R42 Dizziness and giddiness: Secondary | ICD-10-CM | POA: Insufficient documentation

## 2021-07-22 DIAGNOSIS — R110 Nausea: Secondary | ICD-10-CM | POA: Insufficient documentation

## 2021-07-22 DIAGNOSIS — Z8249 Family history of ischemic heart disease and other diseases of the circulatory system: Secondary | ICD-10-CM | POA: Insufficient documentation

## 2021-07-22 DIAGNOSIS — K219 Gastro-esophageal reflux disease without esophagitis: Secondary | ICD-10-CM | POA: Diagnosis not present

## 2021-07-22 DIAGNOSIS — Z833 Family history of diabetes mellitus: Secondary | ICD-10-CM | POA: Diagnosis not present

## 2021-07-22 DIAGNOSIS — Z832 Family history of diseases of the blood and blood-forming organs and certain disorders involving the immune mechanism: Secondary | ICD-10-CM | POA: Insufficient documentation

## 2021-07-22 DIAGNOSIS — N920 Excessive and frequent menstruation with regular cycle: Secondary | ICD-10-CM | POA: Insufficient documentation

## 2021-07-22 DIAGNOSIS — Z9119 Patient's noncompliance with other medical treatment and regimen: Secondary | ICD-10-CM | POA: Insufficient documentation

## 2021-07-22 DIAGNOSIS — I10 Essential (primary) hypertension: Secondary | ICD-10-CM | POA: Insufficient documentation

## 2021-07-22 DIAGNOSIS — D500 Iron deficiency anemia secondary to blood loss (chronic): Secondary | ICD-10-CM | POA: Insufficient documentation

## 2021-07-22 DIAGNOSIS — R079 Chest pain, unspecified: Secondary | ICD-10-CM | POA: Insufficient documentation

## 2021-07-22 DIAGNOSIS — Z841 Family history of disorders of kidney and ureter: Secondary | ICD-10-CM | POA: Diagnosis not present

## 2021-07-22 DIAGNOSIS — R7402 Elevation of levels of lactic acid dehydrogenase (LDH): Secondary | ICD-10-CM | POA: Insufficient documentation

## 2021-07-22 DIAGNOSIS — K30 Functional dyspepsia: Secondary | ICD-10-CM | POA: Insufficient documentation

## 2021-07-22 DIAGNOSIS — K5900 Constipation, unspecified: Secondary | ICD-10-CM | POA: Insufficient documentation

## 2021-07-22 DIAGNOSIS — D539 Nutritional anemia, unspecified: Secondary | ICD-10-CM

## 2021-07-22 DIAGNOSIS — Z86018 Personal history of other benign neoplasm: Secondary | ICD-10-CM | POA: Insufficient documentation

## 2021-07-22 LAB — CBC WITH DIFFERENTIAL (CANCER CENTER ONLY)
Abs Immature Granulocytes: 0.02 10*3/uL (ref 0.00–0.07)
Basophils Absolute: 0 10*3/uL (ref 0.0–0.1)
Basophils Relative: 1 %
Eosinophils Absolute: 0.1 10*3/uL (ref 0.0–0.5)
Eosinophils Relative: 2 %
HCT: 33.9 % — ABNORMAL LOW (ref 36.0–46.0)
Hemoglobin: 10.4 g/dL — ABNORMAL LOW (ref 12.0–15.0)
Immature Granulocytes: 0 %
Lymphocytes Relative: 31 %
Lymphs Abs: 1.8 10*3/uL (ref 0.7–4.0)
MCH: 23.5 pg — ABNORMAL LOW (ref 26.0–34.0)
MCHC: 30.7 g/dL (ref 30.0–36.0)
MCV: 76.5 fL — ABNORMAL LOW (ref 80.0–100.0)
Monocytes Absolute: 0.5 10*3/uL (ref 0.1–1.0)
Monocytes Relative: 9 %
Neutro Abs: 3.4 10*3/uL (ref 1.7–7.7)
Neutrophils Relative %: 57 %
Platelet Count: 425 10*3/uL — ABNORMAL HIGH (ref 150–400)
RBC: 4.43 MIL/uL (ref 3.87–5.11)
RDW: 17 % — ABNORMAL HIGH (ref 11.5–15.5)
WBC Count: 5.8 10*3/uL (ref 4.0–10.5)
nRBC: 0 % (ref 0.0–0.2)

## 2021-07-22 LAB — CMP (CANCER CENTER ONLY)
ALT: 14 U/L (ref 0–44)
AST: 15 U/L (ref 15–41)
Albumin: 3.7 g/dL (ref 3.5–5.0)
Alkaline Phosphatase: 117 U/L (ref 38–126)
Anion gap: 8 (ref 5–15)
BUN: 18 mg/dL (ref 6–20)
CO2: 25 mmol/L (ref 22–32)
Calcium: 9.3 mg/dL (ref 8.9–10.3)
Chloride: 106 mmol/L (ref 98–111)
Creatinine: 1.05 mg/dL — ABNORMAL HIGH (ref 0.44–1.00)
GFR, Estimated: >60 mL/min (ref >60)
Glucose, Bld: 106 mg/dL — ABNORMAL HIGH (ref 70–99)
Potassium: 4 mmol/L (ref 3.5–5.1)
Sodium: 139 mmol/L (ref 135–145)
Total Bilirubin: 0.3 mg/dL (ref 0.3–1.2)
Total Protein: 7.3 g/dL (ref 6.5–8.1)

## 2021-07-22 LAB — RETICULOCYTES
Immature Retic Fract: 21.9 % — ABNORMAL HIGH (ref 2.3–15.9)
RBC.: 4.47 MIL/uL (ref 3.87–5.11)
Retic Count, Absolute: 133.2 10*3/uL (ref 19.0–186.0)
Retic Ct Pct: 3 % (ref 0.4–3.1)

## 2021-07-22 LAB — IRON AND TIBC
Iron: 91 ug/dL (ref 41–142)
Saturation Ratios: 24 % (ref 21–57)
TIBC: 384 ug/dL (ref 236–444)
UIBC: 293 ug/dL (ref 120–384)

## 2021-07-22 LAB — VITAMIN B12: Vitamin B-12: 505 pg/mL (ref 180–914)

## 2021-07-22 LAB — FOLATE: Folate: 20.4 ng/mL (ref >5.9)

## 2021-07-22 LAB — LACTATE DEHYDROGENASE: LDH: 235 U/L — ABNORMAL HIGH (ref 98–192)

## 2021-07-22 LAB — FERRITIN: Ferritin: 28 ng/mL (ref 11–307)

## 2021-07-22 NOTE — Progress Notes (Signed)
Rhonda [phone]   [phone]  CONSULT NOTE  REFERRING PHYSICIAN: Dr. Melinda Crutch  REASON FOR CONSULTATION:  50 years old African-American female with persistent iron deficiency anemia.  HPI Rhonda Young is a 50 y.o. female with past medical history significant for hypertension, borderline diabetes mellitus, GERD, depression, and anxiety, menorrhagia as well as long history of iron deficiency anemia secondary to menorrhagia.  The patient was seen in my clinic more than 10 years ago for evaluation of similar problem.  She was started at that time on treatment with oral iron tablet with Integra plus.  She did fine for a while but she started being more noncompliant with the oral iron tablet because of nausea constipation as well as upset stomach.  She was seen recently by her primary care physician Dr. Harrington Challenger and repeat CBC on 05/24/2021 showed hemoglobin of 10.3 and hematocrit 33.1.  She had elevated platelets count of 501,000.  She had normal white blood count of 8.3.  The patient resumed her treatment with Integra plus for the last 10 days but still not tolerating it well. When seen today she is feeling fine except for occasional chest pain and lightheadedness.  She also has craving for ice but no significant shortness of breath.  She denied having any rectal bleeding but continues to have heavy menstrual cycles that last for around 7 days with clots. She has no current nausea, vomiting, diarrhea or constipation.  She denied having any fever or chills. Family history significant for mother with liver cancer and hemochromatosis.  Father had hypertension and a brother had diabetes mellitus and kidney disease. The patient is single and has 3 sons, 1 deceased in a motor vehicle accident by a drunk driver and 2 living age 58 and 33.  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  She has no history of smoking,  alcohol or drug abuse.  HPI  Past Medical History:  Diagnosis Date   Adrenal adenoma    R side; Gets serial [place] yearly   Allergy    Anxiety    Borderline diabetes    diet controlled   Depression    GERD (gastroesophageal reflux disease)    H/O exercise stress test    ETT 3/17: No ST changes   Hypertension    Iron deficiency anemia    2/2 menstrual cycles   Menorrhagia    Obesity     Past Surgical History:  Procedure Laterality Date   bilateral tubal ligation  1996   COLONOSCOPY     head surgery  1980   50yo   NASAL SINUS SURGERY  2013   UPPER GASTROINTESTINAL ENDOSCOPY      Family History  Problem Relation Age of Onset   Hypertension Mother    Hemachromatosis Mother    Liver cancer Mother    Hypertension Father    Diabetes Brother    Kidney disease Brother    Kidney disease Brother    Other Son        killed by a drunk driver   Heart attack Neg Hx    Colon cancer Neg Hx    Esophageal cancer Neg Hx    Rectal cancer Neg Hx    Stomach cancer Neg Hx     Social History Social History   Tobacco Use   Smoking status: Never   Smokeless tobacco: Never  Vaping Use   Vaping Use: Never used  Substance Use Topics   Alcohol use: Yes    Comment: rare   Drug use: No    No Known Allergies  Current Outpatient Medications  Medication Sig Dispense Refill   Aspirin-Salicylamide-Caffeine (BC HEADACHE POWDER PO) Take 1 packet by mouth daily as needed (headache & pain). For pain.     baclofen (LIORESAL) 10 MG tablet Take 10 mg by mouth as needed.     Cholecalciferol (VITAMIN D3) 1.25 MG (50000 UT) CAPS Take 1 capsule by mouth once a week.     cyclobenzaprine (FLEXERIL) 5 MG tablet Take 5 mg by mouth at bedtime as needed.     FeFum-FePoly-FA-B Cmp-C-Biot (INTEGRA PLUS) CAPS Take 1 capsule by mouth daily.  1   lactulose (CHRONULAC) 10 GM/15ML solution SMARTSIG:Milliliter(s) By Mouth     LINZESS 290 MCG CAPS capsule Take 290 mcg by mouth as needed.      lisinopril-hydrochlorothiazide (PRINZIDE,ZESTORETIC) 10-12.5 MG tablet Take 1 tablet by mouth daily.     meloxicam (MOBIC) 7.5 MG tablet Take 7.5 mg by mouth daily as needed.     methocarbamol (ROBAXIN) 500 MG tablet Take 1 tablet (500 mg total) by mouth 2 (two) times daily. (Patient taking differently: Take 500 mg by mouth as needed.) 20 tablet 0   Multiple Vitamins-Minerals (MULTIVITAMIN GUMMIES ADULT) CHEW Chew 1 tablet by mouth at bedtime.      naproxen (NAPROSYN) 500 MG tablet Take 1 tablet (500 mg total) by mouth 2 (two) times daily. (Patient taking differently: Take 500 mg by mouth as needed.) 30 tablet 0   No current facility-administered medications for this visit.    Review of Systems  Constitutional: positive for fatigue Eyes: negative Ears, nose, mouth, throat, and face: negative Respiratory: positive for dyspnea on exertion and pleurisy/chest pain Cardiovascular: negative Gastrointestinal: negative Genitourinary:negative Integument/breast: negative Hematologic/lymphatic: negative Musculoskeletal:negative Neurological: positive for dizziness Behavioral/Psych: negative Endocrine: negative Allergic/Immunologic: negative  Physical Exam  WUX:LKGMW, healthy, no distress, well nourished, and well developed SKIN: skin color, texture, turgor are normal, no rashes or significant lesions HEAD: Normocephalic, No masses, lesions, tenderness or abnormalities EYES: normal, PERRLA, Conjunctiva are pink and non-injected EARS: External ears normal, Canals clear OROPHARYNX:no exudate, no erythema, and lips, buccal mucosa, and tongue normal  NECK: supple, no adenopathy, no JVD LYMPH:  no palpable lymphadenopathy, no hepatosplenomegaly BREAST:not examined LUNGS: clear to auscultation , and palpation HEART: regular rate & rhythm, no murmurs, and no gallops ABDOMEN:abdomen soft, non-tender, obese, normal bowel sounds, and no masses or organomegaly BACK: No CVA tenderness, Range of  motion is normal EXTREMITIES:no joint deformities, effusion, or inflammation, no edema  NEURO: alert & oriented x 3 with fluent speech, no focal motor/sensory deficits  PERFORMANCE STATUS: ECOG 1  LABORATORY DATA: Lab Results  Component Value Date   WBC 5.8 07/22/2021   HGB 10.4 (L) 07/22/2021   HCT 33.9 (L) 07/22/2021   MCV 76.5 (L) 07/22/2021   PLT 425 (H) 07/22/2021      Chemistry      Component Value Date/Time   NA 139 07/22/2021 1125   K 4.0 07/22/2021 1125   CL 106 07/22/2021 1125   CO2 25 07/22/2021 1125   BUN 18 07/22/2021 1125   CREATININE 1.05 (H) 07/22/2021 1125      Component Value Date/Time   CALCIUM 9.3 07/22/2021 1125   ALKPHOS 117 07/22/2021 1125   AST 15 07/22/2021 1125   ALT 14 07/22/2021 1125   BILITOT 0.3 07/22/2021 1125       RADIOGRAPHIC STUDIES:  No results found.  ASSESSMENT: This is a very pleasant 50 years old African-American female with iron deficiency anemia secondary to chronic blood loss from menorrhagia. The patient has intolerance to the oral iron tablets and she has not been very compliant with the oral medication.   PLAN: I had a lengthy discussion with the patient today about her current condition and treatment options. The patient resumed her oral iron tablets 10 days ago and there is improvement in the serum iron as well as a ferritin. She has normal to elevated reticulocyte count, normal LDH, normal vitamin B12 and serum folate levels.  Serum protein electrophoresis is still pending. Because of the intolerance to the oral iron tablets and noncompliance, I recommended for the patient to proceed with iron infusion with Venofer 300 mg IV weekly for 3 weeks. I will see her back for follow-up visit in 3 months for evaluation and repeat CBC, iron study and ferritin and if needed will arrange for additional iron infusion in the future. The patient agreed to the current plan. She was advised to call immediately if she has any other  concerning symptoms in the interval.  The patient voices understanding of current disease status and treatment options and is in agreement with the current care plan.  All questions were answered. The patient knows to call the clinic with any problems, questions or concerns. We can certainly see the patient much sooner if necessary.  Thank you so much for allowing me to participate in the care of Rhonda Young. I will continue to follow up the patient with you and assist in her care.  The total time spent in the appointment was 60 minutes.  Disclaimer: This note was dictated with voice recognition software. Similar sounding words can inadvertently be transcribed and may not be corrected upon review.   Eilleen Kempf July 22, 2021, 12:21 PM

## 2021-07-23 LAB — PROTEIN ELECTROPHORESIS, SERUM, WITH REFLEX
A/G Ratio: 1 (ref 0.7–1.7)
Albumin ELP: 3.3 g/dL (ref 2.9–4.4)
Alpha-1-Globulin: 0.3 g/dL (ref 0.0–0.4)
Alpha-2-Globulin: 0.7 g/dL (ref 0.4–1.0)
Beta Globulin: 1.1 g/dL (ref 0.7–1.3)
Gamma Globulin: 1.2 g/dL (ref 0.4–1.8)
Globulin, Total: 3.4 g/dL (ref 2.2–3.9)
Total Protein ELP: 6.7 g/dL (ref 6.0–8.5)

## 2021-09-26 ENCOUNTER — Telehealth: Payer: Self-pay | Admitting: Pharmacy Technician

## 2021-09-26 ENCOUNTER — Other Ambulatory Visit: Payer: Self-pay | Admitting: Physician Assistant

## 2021-09-26 ENCOUNTER — Other Ambulatory Visit: Payer: Self-pay

## 2021-09-26 DIAGNOSIS — D500 Iron deficiency anemia secondary to blood loss (chronic): Secondary | ICD-10-CM

## 2021-09-26 NOTE — Telephone Encounter (Signed)
Dr. Toya Smothers,  Auth Submission:NO AUTH NEEDED Payer: [employer] Medication & CPT/J Code(s) submitted: Venofer (Iron Sucrose) J1756 Route of submission (phone, fax, portal): phone Auth type: Buy/Bill Units/visits requested: 3 Reference number:  Approval from: 09/26/21 to 12/25/21   Patient will be scheduled as soon as possible.

## 2021-10-03 ENCOUNTER — Other Ambulatory Visit: Payer: Self-pay

## 2021-10-03 ENCOUNTER — Ambulatory Visit (INDEPENDENT_AMBULATORY_CARE_PROVIDER_SITE_OTHER): Payer: BC Managed Care – PPO

## 2021-10-03 VITALS — BP 132/91 | HR 81 | Temp 98.0°F | Resp 16 | Wt 236.2 lb

## 2021-10-03 DIAGNOSIS — D500 Iron deficiency anemia secondary to blood loss (chronic): Secondary | ICD-10-CM | POA: Diagnosis not present

## 2021-10-03 MED ORDER — FAMOTIDINE IN NACL 20-0.9 MG/50ML-% IV SOLN: 20.0000 mg | Freq: Once | INTRAVENOUS | Status: DC | PRN

## 2021-10-03 MED ORDER — ALBUTEROL SULFATE HFA 108 (90 BASE) MCG/ACT IN AERS: 2.0000 | INHALATION_SPRAY | Freq: Once | RESPIRATORY_TRACT | Status: DC | PRN

## 2021-10-03 MED ORDER — DIPHENHYDRAMINE HCL 50 MG/ML IJ SOLN: 50.0000 mg | Freq: Once | INTRAMUSCULAR | Status: DC | PRN

## 2021-10-03 MED ORDER — DIPHENHYDRAMINE HCL 25 MG PO CAPS
50.0000 mg | ORAL_CAPSULE | Freq: Once | ORAL | Status: AC
  Administered 2021-10-03: 50 mg via ORAL
  Filled 2021-10-03: qty 2

## 2021-10-03 MED ORDER — SODIUM CHLORIDE 0.9 % IV SOLN: Freq: Once | INTRAVENOUS | Status: DC | PRN

## 2021-10-03 MED ORDER — METHYLPREDNISOLONE SODIUM SUCC 125 MG IJ SOLR: 125.0000 mg | Freq: Once | INTRAMUSCULAR | Status: DC | PRN

## 2021-10-03 MED ORDER — SODIUM CHLORIDE 0.9 % IV SOLN
300.0000 mg | Freq: Once | INTRAVENOUS | Status: AC
  Administered 2021-10-03: 300 mg via INTRAVENOUS
  Filled 2021-10-03: qty 15

## 2021-10-03 MED ORDER — ACETAMINOPHEN 325 MG PO TABS
650.0000 mg | ORAL_TABLET | Freq: Once | ORAL | Status: AC
  Administered 2021-10-03: 650 mg via ORAL
  Filled 2021-10-03: qty 2

## 2021-10-03 MED ORDER — EPINEPHRINE 0.3 MG/0.3ML IJ SOAJ: 0.3000 mg | Freq: Once | INTRAMUSCULAR | Status: DC | PRN

## 2021-10-03 NOTE — Progress Notes (Signed)
Diagnosis: Iron Deficiency Anemia  Provider:  Marshell Garfinkel, MD  Procedure: Infusion  IV Type: Peripheral, IV Location: L Hand  Venofer (Iron Sucrose), Dose: 300 mg  Infusion Start Time: 8295  Infusion Stop Time: 6213  Post Infusion IV Care: Observation period completed  Discharge: Condition: Good, Destination: Home . AVS provided to patient.   Performed by:  Paul Dykes, RN

## 2021-10-10 ENCOUNTER — Ambulatory Visit (INDEPENDENT_AMBULATORY_CARE_PROVIDER_SITE_OTHER): Payer: BC Managed Care – PPO

## 2021-10-10 ENCOUNTER — Other Ambulatory Visit: Payer: Self-pay

## 2021-10-10 VITALS — BP 149/98 | HR 71 | Temp 97.8°F | Resp 16 | Ht 62.0 in | Wt 237.8 lb

## 2021-10-10 DIAGNOSIS — D500 Iron deficiency anemia secondary to blood loss (chronic): Secondary | ICD-10-CM | POA: Diagnosis not present

## 2021-10-10 MED ORDER — METHYLPREDNISOLONE SODIUM SUCC 125 MG IJ SOLR: 125.0000 mg | Freq: Once | INTRAMUSCULAR | Status: DC | PRN

## 2021-10-10 MED ORDER — DIPHENHYDRAMINE HCL 50 MG/ML IJ SOLN: 50.0000 mg | Freq: Once | INTRAMUSCULAR | Status: DC | PRN

## 2021-10-10 MED ORDER — ACETAMINOPHEN 325 MG PO TABS
650.0000 mg | ORAL_TABLET | Freq: Once | ORAL | Status: AC
  Administered 2021-10-10: 650 mg via ORAL
  Filled 2021-10-10: qty 2

## 2021-10-10 MED ORDER — EPINEPHRINE 0.3 MG/0.3ML IJ SOAJ: 0.3000 mg | Freq: Once | INTRAMUSCULAR | Status: DC | PRN

## 2021-10-10 MED ORDER — ALBUTEROL SULFATE HFA 108 (90 BASE) MCG/ACT IN AERS: 2.0000 | INHALATION_SPRAY | Freq: Once | RESPIRATORY_TRACT | Status: DC | PRN

## 2021-10-10 MED ORDER — SODIUM CHLORIDE 0.9 % IV SOLN: Freq: Once | INTRAVENOUS | Status: DC | PRN

## 2021-10-10 MED ORDER — DIPHENHYDRAMINE HCL 25 MG PO CAPS
50.0000 mg | ORAL_CAPSULE | Freq: Once | ORAL | Status: AC
  Administered 2021-10-10: 50 mg via ORAL
  Filled 2021-10-10: qty 2

## 2021-10-10 MED ORDER — SODIUM CHLORIDE 0.9 % IV SOLN
300.0000 mg | Freq: Once | INTRAVENOUS | Status: AC
  Administered 2021-10-10: 300 mg via INTRAVENOUS
  Filled 2021-10-10: qty 15

## 2021-10-10 MED ORDER — FAMOTIDINE IN NACL 20-0.9 MG/50ML-% IV SOLN: 20.0000 mg | Freq: Once | INTRAVENOUS | Status: DC | PRN

## 2021-10-10 NOTE — Progress Notes (Addendum)
Diagnosis: Iron Deficiency Anemia  Provider:  Marshell Garfinkel, MD  Procedure: Infusion  IV Type: Peripheral, IV Location: L Hand  Venofer (Iron Sucrose), Dose: 300 mg  Infusion Start Time: 1164  Infusion Stop Time: 3539  Post Infusion IV Care: Peripheral IV Discontinued  Discharge: Condition: Good, Destination: Home . AVS provided to patient.   Performed by:  Sharie Amorin, Sherlon Handing, LPN

## 2021-10-17 ENCOUNTER — Ambulatory Visit (INDEPENDENT_AMBULATORY_CARE_PROVIDER_SITE_OTHER): Payer: BC Managed Care – PPO

## 2021-10-17 ENCOUNTER — Other Ambulatory Visit: Payer: Self-pay

## 2021-10-17 VITALS — BP 127/87 | HR 79 | Temp 98.0°F | Resp 16 | Ht 62.0 in | Wt 238.2 lb

## 2021-10-17 DIAGNOSIS — D500 Iron deficiency anemia secondary to blood loss (chronic): Secondary | ICD-10-CM | POA: Diagnosis not present

## 2021-10-17 MED ORDER — ACETAMINOPHEN 325 MG PO TABS
650.0000 mg | ORAL_TABLET | Freq: Once | ORAL | Status: AC
  Administered 2021-10-17: 09:00:00 650 mg via ORAL
  Filled 2021-10-17: qty 2

## 2021-10-17 MED ORDER — DIPHENHYDRAMINE HCL 25 MG PO CAPS
50.0000 mg | ORAL_CAPSULE | Freq: Once | ORAL | Status: AC
  Administered 2021-10-17: 09:00:00 50 mg via ORAL
  Filled 2021-10-17: qty 2

## 2021-10-17 MED ORDER — SODIUM CHLORIDE 0.9 % IV SOLN: Freq: Once | INTRAVENOUS | Status: DC | PRN

## 2021-10-17 MED ORDER — EPINEPHRINE 0.3 MG/0.3ML IJ SOAJ: 0.3000 mg | Freq: Once | INTRAMUSCULAR | Status: DC | PRN

## 2021-10-17 MED ORDER — DIPHENHYDRAMINE HCL 50 MG/ML IJ SOLN: 50.0000 mg | Freq: Once | INTRAMUSCULAR | Status: DC | PRN

## 2021-10-17 MED ORDER — SODIUM CHLORIDE 0.9 % IV SOLN
300.0000 mg | Freq: Once | INTRAVENOUS | Status: AC
  Administered 2021-10-17: 09:00:00 300 mg via INTRAVENOUS
  Filled 2021-10-17: qty 15

## 2021-10-17 MED ORDER — FAMOTIDINE IN NACL 20-0.9 MG/50ML-% IV SOLN: 20.0000 mg | Freq: Once | INTRAVENOUS | Status: DC | PRN

## 2021-10-17 MED ORDER — METHYLPREDNISOLONE SODIUM SUCC 125 MG IJ SOLR: 125.0000 mg | Freq: Once | INTRAMUSCULAR | Status: DC | PRN

## 2021-10-17 MED ORDER — ALBUTEROL SULFATE HFA 108 (90 BASE) MCG/ACT IN AERS: 2.0000 | INHALATION_SPRAY | Freq: Once | RESPIRATORY_TRACT | Status: DC | PRN

## 2021-10-17 NOTE — Progress Notes (Signed)
Diagnosis: Iron Deficiency Anemia  Provider:  Marshell Garfinkel, MD  Procedure: Infusion  IV Type: Peripheral, IV Location: R Hand  Venofer (Iron Sucrose), Dose: 300 mg  Infusion Start Time: 0920  Infusion Stop Time: 1110  Post Infusion IV Care: Peripheral IV Discontinued  Discharge: Condition: Good, Destination: Home . AVS provided to patient.   Performed by:  Paul Dykes, RN

## 2021-10-22 ENCOUNTER — Inpatient Hospital Stay (HOSPITAL_BASED_OUTPATIENT_CLINIC_OR_DEPARTMENT_OTHER): Payer: BC Managed Care – PPO | Admitting: Internal Medicine

## 2021-10-22 ENCOUNTER — Other Ambulatory Visit: Payer: Self-pay

## 2021-10-22 ENCOUNTER — Other Ambulatory Visit: Payer: Self-pay | Admitting: Internal Medicine

## 2021-10-22 ENCOUNTER — Encounter: Payer: Self-pay | Admitting: Internal Medicine

## 2021-10-22 ENCOUNTER — Other Ambulatory Visit: Payer: Self-pay | Admitting: Medical Oncology

## 2021-10-22 ENCOUNTER — Inpatient Hospital Stay: Payer: BC Managed Care – PPO | Attending: Internal Medicine

## 2021-10-22 VITALS — BP 136/106 | HR 86 | Temp 97.5°F | Resp 18 | Wt 234.0 lb

## 2021-10-22 DIAGNOSIS — R5383 Other fatigue: Secondary | ICD-10-CM | POA: Diagnosis not present

## 2021-10-22 DIAGNOSIS — N920 Excessive and frequent menstruation with regular cycle: Secondary | ICD-10-CM | POA: Insufficient documentation

## 2021-10-22 DIAGNOSIS — Z86018 Personal history of other benign neoplasm: Secondary | ICD-10-CM | POA: Diagnosis not present

## 2021-10-22 DIAGNOSIS — Z79899 Other long term (current) drug therapy: Secondary | ICD-10-CM | POA: Insufficient documentation

## 2021-10-22 DIAGNOSIS — D500 Iron deficiency anemia secondary to blood loss (chronic): Secondary | ICD-10-CM

## 2021-10-22 DIAGNOSIS — F419 Anxiety disorder, unspecified: Secondary | ICD-10-CM | POA: Diagnosis not present

## 2021-10-22 LAB — CBC WITH DIFFERENTIAL (CANCER CENTER ONLY)
Abs Immature Granulocytes: 0.01 10*3/uL (ref 0.00–0.07)
Basophils Absolute: 0 10*3/uL (ref 0.0–0.1)
Basophils Relative: 1 %
Eosinophils Absolute: 0.1 10*3/uL (ref 0.0–0.5)
Eosinophils Relative: 3 %
HCT: 39.3 % (ref 36.0–46.0)
Hemoglobin: 12.2 g/dL (ref 12.0–15.0)
Immature Granulocytes: 0 %
Lymphocytes Relative: 26 %
Lymphs Abs: 1.2 10*3/uL (ref 0.7–4.0)
MCH: 24.8 pg — ABNORMAL LOW (ref 26.0–34.0)
MCHC: 31 g/dL (ref 30.0–36.0)
MCV: 80 fL (ref 80.0–100.0)
Monocytes Absolute: 0.5 10*3/uL (ref 0.1–1.0)
Monocytes Relative: 11 %
Neutro Abs: 2.8 10*3/uL (ref 1.7–7.7)
Neutrophils Relative %: 59 %
Platelet Count: 381 10*3/uL (ref 150–400)
RBC: 4.91 MIL/uL (ref 3.87–5.11)
RDW: 17.2 % — ABNORMAL HIGH (ref 11.5–15.5)
WBC Count: 4.7 10*3/uL (ref 4.0–10.5)
nRBC: 0 % (ref 0.0–0.2)

## 2021-10-22 LAB — CMP (CANCER CENTER ONLY)
ALT: 19 U/L (ref 0–44)
AST: 20 U/L (ref 15–41)
Albumin: 4.2 g/dL (ref 3.5–5.0)
Alkaline Phosphatase: 118 U/L (ref 38–126)
Anion gap: 8 (ref 5–15)
BUN: 20 mg/dL (ref 6–20)
CO2: 26 mmol/L (ref 22–32)
Calcium: 9.2 mg/dL (ref 8.9–10.3)
Chloride: 105 mmol/L (ref 98–111)
Creatinine: 0.92 mg/dL (ref 0.44–1.00)
GFR, Estimated: >60 mL/min (ref >60)
Glucose, Bld: 96 mg/dL (ref 70–99)
Potassium: 4.2 mmol/L (ref 3.5–5.1)
Sodium: 139 mmol/L (ref 135–145)
Total Bilirubin: 0.3 mg/dL (ref 0.3–1.2)
Total Protein: 7.5 g/dL (ref 6.5–8.1)

## 2021-10-22 LAB — IRON AND IRON BINDING CAPACITY (CC-WL,HP ONLY)
Iron: 40 ug/dL (ref 28–170)
Saturation Ratios: 11 % (ref 10.4–31.8)
TIBC: 367 ug/dL (ref 250–450)
UIBC: 327 ug/dL (ref 148–442)

## 2021-10-22 LAB — FERRITIN: Ferritin: 341 ng/mL — ABNORMAL HIGH (ref 11–307)

## 2021-10-22 NOTE — Progress Notes (Signed)
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  PRIOR THERAPY: Iron infusion with Venofer 300 mg IV weekly for 3 weeks.  CURRENT THERAPY: Integra +1 capsule p.o. daily  INTERVAL HISTORY: Rhonda Young 50 y.o. female returns to the clinic today for follow-up visit.  The patient is feeling fine today with no concerning complaints except for an anxiety and concern about her visit.  Her blood pressure is elevated today but normal at home.  She denied having any current chest pain, shortness of breath, cough or hemoptysis.  She denied having any fever or chills.  She has no nausea, vomiting, diarrhea or constipation.  She has no headache or visual changes.  Her fatigue is much better after receiving the iron infusion.  The patient is here today for evaluation and repeat blood work.  MEDICAL HISTORY: Past Medical History:  Diagnosis Date   Adrenal adenoma    R side; Gets serial [place] yearly   Allergy    Anxiety    Borderline diabetes    diet controlled   Depression    GERD (gastroesophageal reflux disease)    H/O exercise stress test    ETT 3/17: No ST changes   Hypertension    Iron deficiency anemia    2/2 menstrual cycles   Menorrhagia    Obesity     ALLERGIES:  is allergic to phentermine.  MEDICATIONS:  Current Outpatient Medications  Medication Sig Dispense Refill   Aspirin-Salicylamide-Caffeine (BC HEADACHE POWDER PO) Take 1 packet by mouth daily as needed (headache & pain). For pain.     baclofen (LIORESAL) 10 MG tablet Take 10 mg by mouth as needed.     Cholecalciferol (VITAMIN D3) 1.25 MG (50000 UT) CAPS Take 1 capsule by mouth once a week.     cyclobenzaprine (FLEXERIL) 5 MG tablet Take 5 mg by mouth at bedtime as needed.     FeFum-FePoly-FA-B  Cmp-C-Biot (INTEGRA PLUS) CAPS Take 1 capsule by mouth daily.  1   gabapentin (NEURONTIN) 300 MG capsule Take 600 mg by mouth 2 (two) times daily.     lactulose (CHRONULAC) 10 GM/15ML solution SMARTSIG:Milliliter(s) By Mouth     LINZESS 290 MCG CAPS capsule Take 290 mcg by mouth as needed.     lisinopril-hydrochlorothiazide (PRINZIDE,ZESTORETIC) 10-12.5 MG tablet Take 1 tablet by mouth daily.     meloxicam (MOBIC) 7.5 MG tablet Take 7.5 mg by mouth daily as needed.     methocarbamol (ROBAXIN) 500 MG tablet Take 1 tablet (500 mg total) by mouth 2 (two) times daily. (Patient taking differently: Take 500 mg by mouth as needed.) 20 tablet 0   Multiple Vitamins-Minerals (MULTIVITAMIN GUMMIES ADULT) CHEW Chew 1 tablet by mouth at bedtime.      naproxen (NAPROSYN) 500 MG tablet Take 1 tablet (500 mg total) by mouth 2 (two) times daily. (Patient taking differently: Take 500 mg by mouth as needed.) 30 tablet 0   No current facility-administered medications for this visit.    SURGICAL HISTORY:  Past Surgical History:  Procedure Laterality Date   bilateral tubal ligation  1996   COLONOSCOPY     head surgery  1980   50yo   NASAL SINUS SURGERY  2013   UPPER GASTROINTESTINAL ENDOSCOPY      REVIEW OF SYSTEMS:  A comprehensive review of systems was negative.   PHYSICAL EXAMINATION: General appearance: alert, cooperative, appears stated age, and no distress Head: Normocephalic, without obvious abnormality, atraumatic Neck: no adenopathy, no JVD, supple, symmetrical, trachea midline, and thyroid not enlarged, symmetric, no tenderness/mass/nodules Lymph nodes: Cervical, supraclavicular, and axillary nodes normal. Resp: clear to auscultation bilaterally Back: symmetric, no curvature. ROM normal. No CVA tenderness. Cardio: regular rate and rhythm, S1, S2 normal, no murmur, click, rub or gallop GI: soft, non-tender; bowel sounds normal; no masses,  no organomegaly Extremities: extremities normal,  atraumatic, no cyanosis or edema  ECOG PERFORMANCE STATUS: 1 - Symptomatic but completely ambulatory  Blood pressure (!) 136/106, pulse 86, temperature (!) 97.5 F (36.4 C), temperature source Tympanic, resp. rate 18, weight 234 lb (106.1 kg), SpO2 100 %.  LABORATORY DATA: Lab Results  Component Value Date   WBC 4.7 10/22/2021   HGB 12.2 10/22/2021   HCT 39.3 10/22/2021   MCV 80.0 10/22/2021   PLT 381 10/22/2021      Chemistry      Component Value Date/Time   NA 139 10/22/2021 1100   K 4.2 10/22/2021 1100   CL 105 10/22/2021 1100   CO2 26 10/22/2021 1100   BUN 20 10/22/2021 1100   CREATININE 0.92 10/22/2021 1100      Component Value Date/Time   CALCIUM 9.2 10/22/2021 1100   ALKPHOS 118 10/22/2021 1100   AST 20 10/22/2021 1100   ALT 19 10/22/2021 1100   BILITOT 0.3 10/22/2021 1100       RADIOGRAPHIC STUDIES: No results found.  ASSESSMENT AND PLAN: This is a very pleasant 50 years old African-American female with history of iron deficiency anemia secondary to menorrhagia.  The patient was treated recently with iron infusion with Venofer 300 mg IV weekly for 3 weeks and her fatigue is better. She had repeat CBC, iron study and ferritin performed earlier today. Her CBC showed improvement of her hemoglobin and hematocrit.  Iron study are within the normal range. Ferritin level still pending. I recommended for the patient to continue on Integra +1 capsule p.o. daily for now. We will see her back for follow-up visit in 3 months for evaluation and repeat CBC, iron study and ferritin. She was advised to call immediately if she has any concerning symptoms in the interval. The patient voices understanding of current disease status and treatment options and is in agreement with the current care plan.  All questions were answered. The patient knows to call the clinic with any problems, questions or concerns. We can certainly see the patient much sooner if necessary.  The total  time spent in the appointment was 20 minutes.  Disclaimer: This note was dictated with voice recognition software. Similar sounding words can inadvertently be transcribed and may not be corrected upon review.

## 2021-11-01 ENCOUNTER — Telehealth: Payer: Self-pay | Admitting: Internal Medicine

## 2021-11-01 ENCOUNTER — Telehealth: Payer: Self-pay

## 2021-11-01 NOTE — Telephone Encounter (Signed)
Pt called wanting to know if leg pain was normal after an iron infusion.  I reviewed pts chart and found that pt received Venofer 10/17/21.  Discussed with Cassandra PA-C who advised bodyaches can occur with iron.  I have called the pt back and she states she has aching in her legs but moreso on the left side and it is only in her thigh area. Pt denies chest pain, SOB, lumps and redness in her legs. Pt has been advised of the aforementioned and can take Tylenol to help with this. Pt expressed understanding of this information.

## 2021-11-01 NOTE — Telephone Encounter (Signed)
Sch per 1/3 los, pt aware

## 2021-11-05 ENCOUNTER — Other Ambulatory Visit: Payer: Self-pay | Admitting: Rehabilitation

## 2021-11-05 DIAGNOSIS — M5416 Radiculopathy, lumbar region: Secondary | ICD-10-CM

## 2021-11-12 ENCOUNTER — Encounter: Payer: Self-pay | Admitting: Internal Medicine

## 2021-11-12 ENCOUNTER — Telehealth: Payer: Self-pay

## 2021-11-12 ENCOUNTER — Other Ambulatory Visit: Payer: Self-pay | Admitting: Internal Medicine

## 2021-11-12 MED ORDER — INTEGRA PLUS PO CAPS: 1.0000 | ORAL_CAPSULE | Freq: Every day | ORAL | 1 refills | Status: AC

## 2021-11-12 NOTE — Telephone Encounter (Signed)
Pt request a rx of Integra Plus.

## 2021-11-26 ENCOUNTER — Other Ambulatory Visit: Payer: BC Managed Care – PPO

## 2021-11-29 ENCOUNTER — Other Ambulatory Visit: Payer: Self-pay | Admitting: Pharmacy Technician

## 2021-11-30 ENCOUNTER — Other Ambulatory Visit: Payer: Self-pay

## 2021-11-30 ENCOUNTER — Ambulatory Visit
Admission: RE | Admit: 2021-11-30 | Discharge: 2021-11-30 | Disposition: A | Discharge status: Home / Self Care | Payer: BC Managed Care – PPO | Source: Ambulatory Visit | Attending: Rehabilitation | Admitting: Rehabilitation

## 2021-11-30 DIAGNOSIS — M5416 Radiculopathy, lumbar region: Secondary | ICD-10-CM

## 2022-01-21 ENCOUNTER — Other Ambulatory Visit: Payer: Self-pay

## 2022-01-21 ENCOUNTER — Inpatient Hospital Stay: Payer: BC Managed Care – PPO | Attending: Internal Medicine

## 2022-01-21 ENCOUNTER — Inpatient Hospital Stay (HOSPITAL_BASED_OUTPATIENT_CLINIC_OR_DEPARTMENT_OTHER): Payer: BC Managed Care – PPO | Admitting: Internal Medicine

## 2022-01-21 VITALS — BP 136/85 | HR 91 | Temp 98.1°F | Resp 18 | Ht 62.0 in | Wt 235.2 lb

## 2022-01-21 DIAGNOSIS — N920 Excessive and frequent menstruation with regular cycle: Secondary | ICD-10-CM | POA: Diagnosis not present

## 2022-01-21 DIAGNOSIS — Z86018 Personal history of other benign neoplasm: Secondary | ICD-10-CM | POA: Diagnosis not present

## 2022-01-21 DIAGNOSIS — D500 Iron deficiency anemia secondary to blood loss (chronic): Secondary | ICD-10-CM | POA: Diagnosis present

## 2022-01-21 DIAGNOSIS — R5383 Other fatigue: Secondary | ICD-10-CM | POA: Insufficient documentation

## 2022-01-21 DIAGNOSIS — Z79899 Other long term (current) drug therapy: Secondary | ICD-10-CM | POA: Diagnosis not present

## 2022-01-21 LAB — CBC WITH DIFFERENTIAL (CANCER CENTER ONLY)
Abs Immature Granulocytes: 0.03 10*3/uL (ref 0.00–0.07)
Basophils Absolute: 0.1 10*3/uL (ref 0.0–0.1)
Basophils Relative: 1 %
Eosinophils Absolute: 0.1 10*3/uL (ref 0.0–0.5)
Eosinophils Relative: 1 %
HCT: 39.2 % (ref 36.0–46.0)
Hemoglobin: 12.8 g/dL (ref 12.0–15.0)
Immature Granulocytes: 1 %
Lymphocytes Relative: 25 %
Lymphs Abs: 1.6 10*3/uL (ref 0.7–4.0)
MCH: 27.2 pg (ref 26.0–34.0)
MCHC: 32.7 g/dL (ref 30.0–36.0)
MCV: 83.2 fL (ref 80.0–100.0)
Monocytes Absolute: 0.7 10*3/uL (ref 0.1–1.0)
Monocytes Relative: 10 %
Neutro Abs: 4.1 10*3/uL (ref 1.7–7.7)
Neutrophils Relative %: 62 %
Platelet Count: 391 10*3/uL (ref 150–400)
RBC: 4.71 MIL/uL (ref 3.87–5.11)
RDW: 14.3 % (ref 11.5–15.5)
WBC Count: 6.6 10*3/uL (ref 4.0–10.5)
nRBC: 0 % (ref 0.0–0.2)

## 2022-01-21 LAB — IRON AND IRON BINDING CAPACITY (CC-WL,HP ONLY)
Iron: 190 ug/dL — ABNORMAL HIGH (ref 28–170)
Saturation Ratios: 53 % — ABNORMAL HIGH (ref 10.4–31.8)
TIBC: 356 ug/dL (ref 250–450)
UIBC: 166 ug/dL (ref 148–442)

## 2022-01-21 LAB — FERRITIN: Ferritin: 85 ng/mL (ref 11–307)

## 2022-01-21 NOTE — Progress Notes (Signed)
?    [facility] ?Telephone:([phone]   [phone] ? ?OFFICE PROGRESS NOTE ? ?[facility] [place] @ [place] ?[place] [address] ? 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 ? ?PRIOR THERAPY: Iron infusion with Venofer 300 mg IV weekly for 3 weeks. ? ?CURRENT THERAPY: Over-the-counter oral iron tablet once daily. ? ?INTERVAL HISTORY: ?Rhonda Young 51 y.o. female returns to the clinic today for follow-up visit.  The patient is feeling fine today with no concerning complaints.  She denied having any fatigue or weakness.  She denied having any nausea, vomiting, diarrhea or constipation.  She has no headache or visual changes.  She has no chest pain, shortness of breath, cough or hemoptysis.  She has been on treatment with Integra +1 capsule p.o. daily until January 2023 then she switched to over-the-counter oral iron tablets once daily and she has been tolerating it fairly well.  She is here today for evaluation and repeat blood work. ? ?MEDICAL HISTORY: ?Past Medical History:  ?Diagnosis Date  ? Adrenal adenoma   ? R side; Gets serial [place] yearly  ? Allergy   ? Anxiety   ? Borderline diabetes   ? diet controlled  ? Depression   ? GERD (gastroesophageal reflux disease)   ? H/O exercise stress test   ? ETT 3/17: No ST changes  ? Hypertension   ? Iron deficiency anemia   ? 2/2 menstrual cycles  ? Menorrhagia   ? Obesity   ? ? ?ALLERGIES:  is allergic to phentermine. ? ?MEDICATIONS:  ?Current Outpatient Medications  ?Medication Sig Dispense Refill  ? Aspirin-Salicylamide-Caffeine (BC HEADACHE POWDER PO) Take 1 packet by mouth daily as needed (headache & pain). For pain.    ? baclofen (LIORESAL) 10 MG tablet Take 10 mg by mouth as needed.    ? Cholecalciferol (VITAMIN D3) 1.25 MG (50000 UT) CAPS Take 1 capsule by mouth once a week.    ? cyclobenzaprine (FLEXERIL) 5 MG tablet Take 5 mg by mouth at bedtime as needed.    ?  FeFum-FePoly-FA-B Cmp-C-Biot (INTEGRA PLUS) CAPS Take 1 capsule by mouth daily. 30 capsule 1  ? gabapentin (NEURONTIN) 300 MG capsule Take 600 mg by mouth 2 (two) times daily.    ? lactulose (CHRONULAC) 10 GM/15ML solution SMARTSIG:Milliliter(s) By Mouth    ? LINZESS 290 MCG CAPS capsule Take 290 mcg by mouth as needed.    ? lisinopril-hydrochlorothiazide (PRINZIDE,ZESTORETIC) 10-12.5 MG tablet Take 1 tablet by mouth daily.    ? meloxicam (MOBIC) 7.5 MG tablet Take 7.5 mg by mouth daily as needed.    ? methocarbamol (ROBAXIN) 500 MG tablet Take 1 tablet (500 mg total) by mouth 2 (two) times daily. (Patient taking differently: Take 500 mg by mouth as needed.) 20 tablet 0  ? Multiple Vitamins-Minerals (MULTIVITAMIN GUMMIES ADULT) CHEW Chew 1 tablet by mouth at bedtime.     ? naproxen (NAPROSYN) 500 MG tablet Take 1 tablet (500 mg total) by mouth 2 (two) times daily. (Patient taking differently: Take 500 mg by mouth as needed.) 30 tablet 0  ? ?No current facility-administered medications for this visit.  ? ? ?SURGICAL HISTORY:  ?Past Surgical History:  ?Procedure Laterality Date  ? bilateral tubal ligation  1996  ? COLONOSCOPY    ? head surgery  1980  ? 51yo  ? NASAL SINUS SURGERY  2013  ? UPPER GASTROINTESTINAL ENDOSCOPY    ? ? ?REVIEW OF  SYSTEMS:  A comprehensive review of systems was negative.  ? ?PHYSICAL EXAMINATION: General appearance: alert, cooperative, appears stated age, and no distress ?Head: Normocephalic, without obvious abnormality, atraumatic ?Neck: no adenopathy, no JVD, supple, symmetrical, trachea midline, and thyroid not enlarged, symmetric, no tenderness/mass/nodules ?Lymph nodes: Cervical, supraclavicular, and axillary nodes normal. ?Resp: clear to auscultation bilaterally ?Back: symmetric, no curvature. ROM normal. No CVA tenderness. ?Cardio: regular rate and rhythm, S1, S2 normal, no murmur, click, rub or gallop ?GI: soft, non-tender; bowel sounds normal; no masses,  no organomegaly ?Extremities:  extremities normal, atraumatic, no cyanosis or edema ? ?ECOG PERFORMANCE STATUS: 0 - Asymptomatic ? ?Blood pressure 136/85, pulse 91, temperature 98.1 ?F (36.7 ?C), temperature source Oral, resp. rate 18, height '5\' 2"'$  (1.575 m), weight 235 lb 3.2 oz (106.7 kg), SpO2 100 %. ? ?LABORATORY DATA: ?Lab Results  ?Component Value Date  ? WBC 6.6 01/21/2022  ? HGB 12.8 01/21/2022  ? HCT 39.2 01/21/2022  ? MCV 83.2 01/21/2022  ? PLT 391 01/21/2022  ? ? ?  Chemistry   ?   ?Component Value Date/Time  ? NA 139 10/22/2021 1100  ? K 4.2 10/22/2021 1100  ? CL 105 10/22/2021 1100  ? CO2 26 10/22/2021 1100  ? BUN 20 10/22/2021 1100  ? CREATININE 0.92 10/22/2021 1100  ?    ?Component Value Date/Time  ? CALCIUM 9.2 10/22/2021 1100  ? ALKPHOS 118 10/22/2021 1100  ? AST 20 10/22/2021 1100  ? ALT 19 10/22/2021 1100  ? BILITOT 0.3 10/22/2021 1100  ?  ? ? ? ?RADIOGRAPHIC STUDIES: ?No results found. ? ?ASSESSMENT AND PLAN: This is a very pleasant 51 years old African-American female with history of iron deficiency anemia secondary to menorrhagia.  The patient was treated recently with iron infusion with Venofer 300 mg IV weekly for 3 weeks and her fatigue is better. ?The patient was then treated with oral iron tablet with Integra +1 capsule p.o. daily until January 2023 before she switch it to over-the-counter oral iron tablets once daily.  She has been tolerating it fairly well. ?She had repeat CBC, iron study and ferritin performed today.  Her CBC showed no evidence of anemia and her serum iron is above the normal range with good iron saturation.  Ferritin level is still pending. ?I recommended for the patient to continue on the oral iron tablet at least every other day for now. ?I will see her back for follow-up visit in 6 months for evaluation and repeat blood work. ?She was advised to call immediately if she has any other concerning symptoms in the interval. ?The patient voices understanding of current disease status and treatment  options and is in agreement with the current care plan. ? ?All questions were answered. The patient knows to call the clinic with any problems, questions or concerns. We can certainly see the patient much sooner if necessary. ? ?The total time spent in the appointment was 20 minutes. ? ?Disclaimer: This note was dictated with voice recognition software. Similar sounding words can inadvertently be transcribed and may not be corrected upon review. ? ? ?  ?   ?

## 2022-05-08 ENCOUNTER — Telehealth: Payer: Self-pay | Admitting: Internal Medicine

## 2022-05-08 NOTE — Telephone Encounter (Signed)
Called patient regarding upcoming September appointments, patient is notified.  

## 2022-07-22 ENCOUNTER — Encounter: Payer: Self-pay | Admitting: Internal Medicine

## 2022-07-22 ENCOUNTER — Inpatient Hospital Stay: Payer: BC Managed Care – PPO | Attending: Internal Medicine

## 2022-07-22 ENCOUNTER — Other Ambulatory Visit: Payer: Self-pay

## 2022-07-22 ENCOUNTER — Inpatient Hospital Stay (HOSPITAL_BASED_OUTPATIENT_CLINIC_OR_DEPARTMENT_OTHER): Payer: BC Managed Care – PPO | Admitting: Internal Medicine

## 2022-07-22 VITALS — BP 130/79 | HR 87 | Temp 98.5°F | Resp 15 | Wt 240.1 lb

## 2022-07-22 DIAGNOSIS — Z79899 Other long term (current) drug therapy: Secondary | ICD-10-CM | POA: Diagnosis not present

## 2022-07-22 DIAGNOSIS — N920 Excessive and frequent menstruation with regular cycle: Secondary | ICD-10-CM | POA: Diagnosis present

## 2022-07-22 DIAGNOSIS — D500 Iron deficiency anemia secondary to blood loss (chronic): Secondary | ICD-10-CM | POA: Insufficient documentation

## 2022-07-22 LAB — CBC WITH DIFFERENTIAL (CANCER CENTER ONLY)
Abs Immature Granulocytes: 0.02 10*3/uL (ref 0.00–0.07)
Basophils Absolute: 0.1 10*3/uL (ref 0.0–0.1)
Basophils Relative: 1 %
Eosinophils Absolute: 0.1 10*3/uL (ref 0.0–0.5)
Eosinophils Relative: 1 %
HCT: 39.7 % (ref 36.0–46.0)
Hemoglobin: 13.2 g/dL (ref 12.0–15.0)
Immature Granulocytes: 0 %
Lymphocytes Relative: 25 %
Lymphs Abs: 1.3 10*3/uL (ref 0.7–4.0)
MCH: 27.4 pg (ref 26.0–34.0)
MCHC: 33.2 g/dL (ref 30.0–36.0)
MCV: 82.5 fL (ref 80.0–100.0)
Monocytes Absolute: 0.5 10*3/uL (ref 0.1–1.0)
Monocytes Relative: 10 %
Neutro Abs: 3.1 10*3/uL (ref 1.7–7.7)
Neutrophils Relative %: 63 %
Platelet Count: 373 10*3/uL (ref 150–400)
RBC: 4.81 MIL/uL (ref 3.87–5.11)
RDW: 13.7 % (ref 11.5–15.5)
WBC Count: 5.1 10*3/uL (ref 4.0–10.5)
nRBC: 0 % (ref 0.0–0.2)

## 2022-07-22 LAB — FERRITIN: Ferritin: 32 ng/mL (ref 11–307)

## 2022-07-22 LAB — IRON AND IRON BINDING CAPACITY (CC-WL,HP ONLY)
Iron: 54 ug/dL (ref 28–170)
Saturation Ratios: 13 % (ref 10.4–31.8)
TIBC: 402 ug/dL (ref 250–450)
UIBC: 348 ug/dL (ref 148–442)

## 2022-07-22 NOTE — Progress Notes (Signed)
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  PRIOR THERAPY: Iron infusion with Venofer 300 mg IV weekly for 3 weeks.  CURRENT THERAPY: Integra +1 capsule p.o. few times a week.  INTERVAL HISTORY: Rhonda Young 51 y.o. female returns to the clinic today for follow-up visit.  The patient is feeling fine today with no concerning complaints.  She denied having any chest pain, shortness of breath, cough or hemoptysis.  She has no nausea, vomiting, diarrhea or constipation.  She has no headache or visual changes.  She denied having any recent weight loss or night sweats.  She takes her iron tablets only when she remembers.  She is here today for evaluation with repeat CBC, iron study and ferritin. MEDICAL HISTORY: Past Medical History:  Diagnosis Date   Adrenal adenoma    R side; Gets serial [place] yearly   Allergy    Anxiety    Borderline diabetes    diet controlled   Depression    GERD (gastroesophageal reflux disease)    H/O exercise stress test    ETT 3/17: No ST changes   Hypertension    Iron deficiency anemia    2/2 menstrual cycles   Menorrhagia    Obesity     ALLERGIES:  is allergic to phentermine.  MEDICATIONS:  Current Outpatient Medications  Medication Sig Dispense Refill   Aspirin-Salicylamide-Caffeine (BC HEADACHE POWDER PO) Take 1 packet by mouth daily as needed (headache & pain). For pain.     baclofen (LIORESAL) 10 MG tablet Take 10 mg by mouth as needed.     Cholecalciferol (VITAMIN D3) 1.25 MG (50000 UT) CAPS Take 1 capsule by mouth once a week.     cyclobenzaprine (FLEXERIL) 5 MG tablet Take 5 mg by mouth at bedtime as needed.     FeFum-FePoly-FA-B Cmp-C-Biot (INTEGRA PLUS) CAPS Take 1 capsule by mouth daily. 30 capsule 1   gabapentin  (NEURONTIN) 300 MG capsule Take 600 mg by mouth 2 (two) times daily.     lactulose (CHRONULAC) 10 GM/15ML solution SMARTSIG:Milliliter(s) By Mouth     LINZESS 290 MCG CAPS capsule Take 290 mcg by mouth as needed.     lisinopril-hydrochlorothiazide (PRINZIDE,ZESTORETIC) 10-12.5 MG tablet Take 1 tablet by mouth daily.     meloxicam (MOBIC) 7.5 MG tablet Take 7.5 mg by mouth daily as needed.     methocarbamol (ROBAXIN) 500 MG tablet Take 1 tablet (500 mg total) by mouth 2 (two) times daily. (Patient taking differently: Take 500 mg by mouth as needed.) 20 tablet 0   Multiple Vitamins-Minerals (MULTIVITAMIN GUMMIES ADULT) CHEW Chew 1 tablet by mouth at bedtime.      naproxen (NAPROSYN) 500 MG tablet Take 1 tablet (500 mg total) by mouth 2 (two) times daily. (Patient taking differently: Take 500 mg by mouth as needed.) 30 tablet 0   oxybutynin (DITROPAN-XL) 10 MG 24 hr tablet Take 10 mg by mouth every morning.     sucralfate (CARAFATE) 1 g tablet Take 1 g by mouth 2 (two) times daily.     No current facility-administered medications for this visit.    SURGICAL HISTORY:  Past Surgical History:  Procedure Laterality Date   bilateral tubal ligation  1996   COLONOSCOPY     head surgery  1980  51yo   NASAL SINUS SURGERY  2013   UPPER GASTROINTESTINAL ENDOSCOPY      REVIEW OF SYSTEMS:  A comprehensive review of systems was negative.   PHYSICAL EXAMINATION: General appearance: alert, cooperative, appears stated age, and no distress Head: Normocephalic, without obvious abnormality, atraumatic Neck: no adenopathy, no JVD, supple, symmetrical, trachea midline, and thyroid not enlarged, symmetric, no tenderness/mass/nodules Lymph nodes: Cervical, supraclavicular, and axillary nodes normal. Resp: clear to auscultation bilaterally Back: symmetric, no curvature. ROM normal. No CVA tenderness. Cardio: regular rate and rhythm, S1, S2 normal, no murmur, click, rub or gallop GI: soft, non-tender; bowel  sounds normal; no masses,  no organomegaly Extremities: extremities normal, atraumatic, no cyanosis or edema  ECOG PERFORMANCE STATUS: 0 - Asymptomatic  Blood pressure 130/79, pulse 87, temperature 98.5 F (36.9 C), temperature source Oral, resp. rate 15, weight 240 lb 1.6 oz (108.9 kg), SpO2 98 %.  LABORATORY DATA: Lab Results  Component Value Date   WBC 5.1 07/22/2022   HGB 13.2 07/22/2022   HCT 39.7 07/22/2022   MCV 82.5 07/22/2022   PLT 373 07/22/2022      Chemistry      Component Value Date/Time   NA 139 10/22/2021 1100   K 4.2 10/22/2021 1100   CL 105 10/22/2021 1100   CO2 26 10/22/2021 1100   BUN 20 10/22/2021 1100   CREATININE 0.92 10/22/2021 1100      Component Value Date/Time   CALCIUM 9.2 10/22/2021 1100   ALKPHOS 118 10/22/2021 1100   AST 20 10/22/2021 1100   ALT 19 10/22/2021 1100   BILITOT 0.3 10/22/2021 1100       RADIOGRAPHIC STUDIES: No results found.  ASSESSMENT AND PLAN: This is a very pleasant 52 years old African-American female with history of iron deficiency anemia secondary to menorrhagia.  The patient was treated recently with iron infusion with Venofer 300 mg IV weekly for 3 weeks and her fatigue is better. The patient was then treated with oral iron tablet with Integra +1 capsule p.o. daily until January 2023 before she switch it to over-the-counter oral iron tablets once daily.   The patient has been tolerating the oral iron tablets fairly well.  She takes it when she remembers. Repeat CBC today is unremarkable for any anemia or other abnormality.  Iron study and ferritin are still pending. I recommended for the patient to continue with the oral iron tablets few times a week. I will see her on an as-needed basis at this point. The patient will continue her routine follow-up visit and evaluation by her primary care physician. She was advised to call if she has any concerning symptoms. The patient voices understanding of current disease  status and treatment options and is in agreement with the current care plan.  All questions were answered. The patient knows to call the clinic with any problems, questions or concerns. We can certainly see the patient much sooner if necessary.  The total time spent in the appointment was 20 minutes.  Disclaimer: This note was dictated with voice recognition software. Similar sounding words can inadvertently be transcribed and may not be corrected upon review.

## 2023-05-06 ENCOUNTER — Encounter (HOSPITAL_COMMUNITY): Payer: Self-pay | Admitting: Emergency Medicine

## 2023-05-06 ENCOUNTER — Emergency Department (HOSPITAL_COMMUNITY)
Admission: EM | Admit: 2023-05-06 | Discharge: 2023-05-06 | Disposition: A | Discharge status: Home / Self Care | Payer: BC Managed Care – PPO | Attending: Emergency Medicine | Admitting: Emergency Medicine

## 2023-05-06 ENCOUNTER — Emergency Department (HOSPITAL_COMMUNITY): Payer: BC Managed Care – PPO

## 2023-05-06 DIAGNOSIS — R0789 Other chest pain: Secondary | ICD-10-CM | POA: Diagnosis present

## 2023-05-06 DIAGNOSIS — K273 Acute peptic ulcer, site unspecified, without hemorrhage or perforation: Secondary | ICD-10-CM | POA: Diagnosis not present

## 2023-05-06 DIAGNOSIS — Z79899 Other long term (current) drug therapy: Secondary | ICD-10-CM | POA: Insufficient documentation

## 2023-05-06 DIAGNOSIS — K279 Peptic ulcer, site unspecified, unspecified as acute or chronic, without hemorrhage or perforation: Secondary | ICD-10-CM

## 2023-05-06 LAB — BASIC METABOLIC PANEL
Anion gap: 9 (ref 5–15)
BUN: 14 mg/dL (ref 6–20)
CO2: 23 mmol/L (ref 22–32)
Calcium: 8.7 mg/dL — ABNORMAL LOW (ref 8.9–10.3)
Chloride: 105 mmol/L (ref 98–111)
Creatinine, Ser: 0.84 mg/dL (ref 0.44–1.00)
GFR, Estimated: >60 mL/min (ref >60)
Glucose, Bld: 96 mg/dL (ref 70–99)
Potassium: 3.7 mmol/L (ref 3.5–5.1)
Sodium: 137 mmol/L (ref 135–145)

## 2023-05-06 LAB — TROPONIN I (HIGH SENSITIVITY)
Troponin I (High Sensitivity): 2 ng/L (ref <18)
Troponin I (High Sensitivity): 3 ng/L (ref <18)

## 2023-05-06 LAB — CBC
HCT: 38.9 % (ref 36.0–46.0)
Hemoglobin: 11.9 g/dL — ABNORMAL LOW (ref 12.0–15.0)
MCH: 25 pg — ABNORMAL LOW (ref 26.0–34.0)
MCHC: 30.6 g/dL (ref 30.0–36.0)
MCV: 81.7 fL (ref 80.0–100.0)
Platelets: 407 10*3/uL — ABNORMAL HIGH (ref 150–400)
RBC: 4.76 MIL/uL (ref 3.87–5.11)
RDW: 15.9 % — ABNORMAL HIGH (ref 11.5–15.5)
WBC: 8.5 10*3/uL (ref 4.0–10.5)
nRBC: 0 % (ref 0.0–0.2)

## 2023-05-06 LAB — LIPASE, BLOOD: Lipase: 37 U/L (ref 11–51)

## 2023-05-06 MED ORDER — NITROGLYCERIN 0.4 MG SL SUBL: 0.4000 mg | SUBLINGUAL_TABLET | SUBLINGUAL | Status: DC | PRN

## 2023-05-06 MED ORDER — KETOROLAC TROMETHAMINE 30 MG/ML IJ SOLN
15.0000 mg | Freq: Once | INTRAMUSCULAR | Status: AC
  Administered 2023-05-06: 15 mg via INTRAVENOUS
  Filled 2023-05-06: qty 1

## 2023-05-06 MED ORDER — SUCRALFATE 1 G PO TABS: 1.0000 g | ORAL_TABLET | Freq: Three times a day (TID) | ORAL | 0 refills | Status: AC

## 2023-05-06 MED ORDER — PANTOPRAZOLE SODIUM 20 MG PO TBEC: 40.0000 mg | DELAYED_RELEASE_TABLET | Freq: Every day | ORAL | 0 refills | Status: AC

## 2023-05-06 MED ORDER — PANTOPRAZOLE SODIUM 40 MG IV SOLR
40.0000 mg | Freq: Once | INTRAVENOUS | Status: AC
  Administered 2023-05-06: 40 mg via INTRAVENOUS
  Filled 2023-05-06: qty 10

## 2023-05-06 MED ORDER — ALUM & MAG HYDROXIDE-SIMETH 200-200-20 MG/5ML PO SUSP
30.0000 mL | Freq: Once | ORAL | Status: AC
  Administered 2023-05-06: 30 mL via ORAL
  Filled 2023-05-06: qty 30

## 2023-05-06 NOTE — ED Triage Notes (Signed)
Pt endorses mid CP that started a few days ago but worsened yesterday. Endorses back pain, nausea and light-headedness. Pain radiates to both arms. Reports recent job change with increased stress.

## 2023-05-06 NOTE — Discharge Instructions (Signed)
We saw you in the ER for the chest pain/shortness of breath. All of our cardiac workup is normal, including labs, EKG and chest X-RAY are normal. We are not sure what is causing your discomfort, but we feel comfortable sending you home at this time. The workup in the ER is not complete, and you should follow up with your primary care doctor for further evaluation and also your GI doctor.  Please return to the ER if you have worsening chest pain, shortness of breath, pain radiating to your jaw, shoulder, or back, sweats or fainting.

## 2023-05-06 NOTE — ED Provider Notes (Signed)
[place] EMERGENCY DEPARTMENT AT [facility] Provider Note   CSN: [id number] Arrival date & time: 05/06/23  1727     History  Chief Complaint  Patient presents with   Chest Pain    Rhonda Young is a 52 y.o. female.  HPI    52 year old female comes in with chief complaint of chest pain.  Patient indicates that the chest pain started yesterday.  Chest pain is in the midsternal region, intermittently the pain has been radiating to the back.  Patient also has noted some discomfort alternate between left and right arm.  Review of systems negative for shortness of breath, nausea, dizziness, sweats.  Patient has noticed off-and-on chest pain, but the chest pain today was more intense and more severe.  Normally she will take Goody powder and the pain will resolve in an hour.  She does have known history of esophagitis.  She does not currently take any PPI as she has not had any GERD like symptoms recently.  Home Medications Prior to Admission medications   Medication Sig Start Date End Date Taking? Authorizing Provider  Aspirin-Salicylamide-Caffeine (BC HEADACHE POWDER PO) Take 1 packet by mouth daily as needed (headache & pain). For pain.    [provider]  baclofen (LIORESAL) 10 MG tablet Take 10 mg by mouth as needed. 11/10/20   [provider]  Cholecalciferol (VITAMIN D3) 1.25 MG (50000 UT) CAPS Take 1 capsule by mouth once a week.    [provider]  cyclobenzaprine (FLEXERIL) 5 MG tablet Take 5 mg by mouth at bedtime as needed. 03/27/21   [provider]  FeFum-FePoly-FA-B Cmp-C-Biot (INTEGRA PLUS) CAPS Take 1 capsule by mouth daily. 11/12/21   Si Gaul, MD  gabapentin (NEURONTIN) 300 MG capsule Take 600 mg by mouth 2 (two) times daily. 07/13/21   [provider]  lactulose (CHRONULAC) 10 GM/15ML solution SMARTSIG:Milliliter(s) By Mouth 09/28/20   [provider]  LINZESS 290 MCG CAPS capsule Take 290 mcg by mouth  as needed. 08/17/20   [provider]  lisinopril-hydrochlorothiazide (PRINZIDE,ZESTORETIC) 10-12.5 MG tablet Take 1 tablet by mouth daily.    [provider]  meloxicam (MOBIC) 7.5 MG tablet Take 7.5 mg by mouth daily as needed. 03/27/21   [provider]  methocarbamol (ROBAXIN) 500 MG tablet Take 1 tablet (500 mg total) by mouth 2 (two) times daily. Patient taking differently: Take 500 mg by mouth as needed. 10/17/20   Henderly, Britni A, PA-C  Multiple Vitamins-Minerals (MULTIVITAMIN GUMMIES ADULT) CHEW Chew 1 tablet by mouth at bedtime.     [provider]  naproxen (NAPROSYN) 500 MG tablet Take 1 tablet (500 mg total) by mouth 2 (two) times daily. Patient taking differently: Take 500 mg by mouth as needed. 10/17/20   Henderly, Britni A, PA-C  oxybutynin (DITROPAN-XL) 10 MG 24 hr tablet Take 10 mg by mouth every morning. 07/09/22   [provider]  sucralfate (CARAFATE) 1 g tablet Take 1 g by mouth 2 (two) times daily. 07/07/22   [provider]      Allergies    Phentermine    Review of Systems   Review of Systems  All other systems reviewed and are negative.   Physical Exam Updated Vital Signs BP (!) 147/83 (BP Location: Left Arm)   Pulse 76   Temp 97.8 F (36.6 C) (Oral)   Resp 18   Ht 5\' 2"  (1.575 m)   Wt 108 kg   SpO2 98%  BMI 43.53 kg/m  Physical Exam Vitals and nursing note reviewed.  Constitutional:      Appearance: She is well-developed.  HENT:     Head: Atraumatic.  Cardiovascular:     Rate and Rhythm: Normal rate.  Pulmonary:     Effort: Pulmonary effort is normal.     Breath sounds: Normal breath sounds.  Abdominal:     Palpations: Abdomen is soft.  Musculoskeletal:     Cervical back: Normal range of motion and neck supple.  Skin:    General: Skin is warm and dry.  Neurological:     Mental Status: She is alert and oriented to person, place, and time.     ED Results / Procedures / Treatments    Labs (all labs ordered are listed, but only abnormal results are displayed) Labs Reviewed  BASIC METABOLIC PANEL - Abnormal; Notable for the following components:      Result Value   Calcium 8.7 (*)    All other components within normal limits  CBC - Abnormal; Notable for the following components:   Hemoglobin 11.9 (*)    MCH 25.0 (*)    RDW 15.9 (*)    Platelets 407 (*)    All other components within normal limits  LIPASE, BLOOD  HCG, SERUM, QUALITATIVE  TROPONIN I (HIGH SENSITIVITY)  TROPONIN I (HIGH SENSITIVITY)    EKG EKG Interpretation Date/Time:  Wednesday May 06 2023 17:37:58 EDT Ventricular Rate:  79 PR Interval:  152 QRS Duration:  87 QT Interval:  405 QTC Calculation: 465 R Axis:   5  Text Interpretation: Sinus rhythm No acute changes No significant change since last tracing Confirmed by Derwood Kaplan [phone]) on 05/06/2023 6:02:11 PM  Radiology DG Chest 2 View  Result Date: 05/06/2023 CLINICAL DATA:  Chest pain, back pain EXAM: CHEST - 2 VIEW COMPARISON:  12/13/2015 FINDINGS: Transverse diameter of heart is increased. Thoracic aorta is tortuous. There are no signs of pulmonary edema or focal pulmonary consolidation. There is no pleural effusion or pneumothorax. IMPRESSION: No active cardiopulmonary disease. Electronically Signed   By: Ernie Avena M.D.   On: 05/06/2023 18:11    Procedures Procedures    Medications Ordered in ED Medications  nitroGLYCERIN (NITROSTAT) SL tablet 0.4 mg (has no administration in time range)  alum & mag hydroxide-simeth (MAALOX/MYLANTA) 200-200-20 MG/5ML suspension 30 mL (30 mLs Oral Given 05/06/23 1945)  ketorolac (TORADOL) 30 MG/ML injection 15 mg (15 mg Intravenous Given 05/06/23 2135)  pantoprazole (PROTONIX) injection 40 mg (40 mg Intravenous Given 05/06/23 2136)    ED Course/ Medical Decision Making/ A&P             HEART Score: 3                Medical Decision Making Amount and/or Complexity of Data  Reviewed Labs: ordered. Radiology: ordered.  Risk OTC drugs. Prescription drug management.   This patient presents to the ED with chief complaint(s) of midsternal chest pain with radiation to the back, shoulder area pertinent past medical history of GERD/esophagitis, hypertension.The complaint involves an extensive differential diagnosis and also carries with it a high risk of complications and morbidity.    The differential diagnosis includes : ACS syndrome Aortic dissection Myocarditis Pericarditis Endocarditis Pneumonia Pleural effusion / Pulmonary edema PE Pneumothorax Musculoskeletal pain PUD / Gastritis / Esophagitis Esophageal spasm   Pain is not positional, not pleuritic.  Patient is PERC negative. Pain has been present since last night, constantly, EKG is reassuring.  This  makes ACS less likely. No recent viral illnesses, low suspicion for myocarditis based on EKG alone.  The initial plan is to get basic labs and reassess the patient.  Will also give a GI cocktail.   Additional history obtained: Records reviewed  previous outpatient workup including medications .  Patient had upper endoscopy in 2022 that revealed gastritis.  There is no evidence of esophagitis at that time.  Independent labs interpretation:  The following labs were independently interpreted: CBC, BMP, lipase and initial high-sensitivity troponin are normal.  Independent visualization and interpretation of imaging: - I independently visualized the following imaging with scope of interpretation limited to determining acute life threatening conditions related to emergency care: X-ray of the chest, which revealed no evidence of pneumothorax  Treatment and Reassessment: Patient reassessed.  She did not feel significant improvement with GI cocktail. She is PERC negative, hear score is 3.  Social history is unremarkable.  Vascular exam is reassuring -2+ radial pulse bilaterally.  She has a murmur, but it  is known.  I do not think she needs a dissection study.  Plan is to get delta tropes, if negative then she can be discharged with outpatient GI and PCP follow-up.   Final Clinical Impression(s) / ED Diagnoses Final diagnoses:  Atypical chest pain  PUD (peptic ulcer disease)    Rx / DC Orders ED Discharge Orders     None         Derwood Kaplan, MD 05/06/23 2222

## 2023-12-09 ENCOUNTER — Other Ambulatory Visit: Payer: Self-pay | Admitting: Obstetrics & Gynecology

## 2023-12-09 DIAGNOSIS — R928 Other abnormal and inconclusive findings on diagnostic imaging of breast: Secondary | ICD-10-CM

## 2023-12-18 ENCOUNTER — Ambulatory Visit
Admission: RE | Admit: 2023-12-18 | Discharge: 2023-12-18 | Disposition: A | Discharge status: Home / Self Care | Payer: Self-pay | Source: Ambulatory Visit | Attending: Obstetrics & Gynecology | Admitting: Obstetrics & Gynecology

## 2023-12-18 ENCOUNTER — Ambulatory Visit: Payer: Self-pay

## 2023-12-18 ENCOUNTER — Other Ambulatory Visit: Payer: Self-pay

## 2023-12-18 DIAGNOSIS — R928 Other abnormal and inconclusive findings on diagnostic imaging of breast: Secondary | ICD-10-CM

## 2024-01-22 ENCOUNTER — Encounter (HOSPITAL_COMMUNITY): Payer: Self-pay | Admitting: Emergency Medicine

## 2024-01-22 ENCOUNTER — Other Ambulatory Visit: Payer: Self-pay

## 2024-01-22 ENCOUNTER — Emergency Department (HOSPITAL_COMMUNITY)
Admission: EM | Admit: 2024-01-22 | Discharge: 2024-01-22 | Disposition: A | Discharge status: Home / Self Care | Attending: Emergency Medicine | Admitting: Emergency Medicine

## 2024-01-22 ENCOUNTER — Emergency Department (HOSPITAL_COMMUNITY)

## 2024-01-22 DIAGNOSIS — W109XXA Fall (on) (from) unspecified stairs and steps, initial encounter: Secondary | ICD-10-CM | POA: Insufficient documentation

## 2024-01-22 DIAGNOSIS — Z7982 Long term (current) use of aspirin: Secondary | ICD-10-CM | POA: Insufficient documentation

## 2024-01-22 DIAGNOSIS — R079 Chest pain, unspecified: Secondary | ICD-10-CM | POA: Insufficient documentation

## 2024-01-22 DIAGNOSIS — Y9302 Activity, running: Secondary | ICD-10-CM | POA: Insufficient documentation

## 2024-01-22 DIAGNOSIS — M545 Low back pain, unspecified: Secondary | ICD-10-CM | POA: Diagnosis present

## 2024-01-22 DIAGNOSIS — Y92009 Unspecified place in unspecified non-institutional (private) residence as the place of occurrence of the external cause: Secondary | ICD-10-CM | POA: Insufficient documentation

## 2024-01-22 DIAGNOSIS — W19XXXA Unspecified fall, initial encounter: Secondary | ICD-10-CM

## 2024-01-22 MED ORDER — KETOROLAC TROMETHAMINE 15 MG/ML IJ SOLN
15.0000 mg | Freq: Once | INTRAMUSCULAR | Status: AC
  Administered 2024-01-22: 15 mg via INTRAMUSCULAR
  Filled 2024-01-22: qty 1

## 2024-01-22 NOTE — ED Triage Notes (Addendum)
 While attempting to run down the steps, the patient slip and fell in her buttock. She continued to slide on her bottom down about 12 steps. Since then, she has back pain, buttock pain, chest pain and left arm pain. She reports landing on her left arm.

## 2024-01-22 NOTE — ED Provider Notes (Signed)
 [place] EMERGENCY DEPARTMENT AT [facility] Provider Note   CSN: [id number] Arrival date & time: 01/22/24  1837     History  Chief Complaint  Patient presents with   Back Pain   Fall   Chest Pain    Rhonda Young is a 53 y.o. female.  53 year old female with prior medical history as detailed below presents for evaluation.  Patient reports that on Tuesday she had a fall at home.  She landed hard on her backside after slipping on steps.  After falling initially she then proceeded to slide down the steps on her bottom.  She reports going on about 12 steps.  After the fall she was able to get up.  She is gone to work for the last several days.  She complains of persistent low back pain.  She was seen earlier today at urgent care.  She was referred to the ED for evaluation.  The history is provided by the patient.       Home Medications Prior to Admission medications   Medication Sig Start Date End Date Taking? Authorizing Provider  Aspirin-Salicylamide-Caffeine (BC HEADACHE POWDER PO) Take 1 packet by mouth daily as needed (headache & pain). For pain.    [provider]  baclofen (LIORESAL) 10 MG tablet Take 10 mg by mouth as needed. 11/10/20   [provider]  Cholecalciferol (VITAMIN D3) 1.25 MG (50000 UT) CAPS Take 1 capsule by mouth once a week.    [provider]  cyclobenzaprine (FLEXERIL) 5 MG tablet Take 5 mg by mouth at bedtime as needed. 03/27/21   [provider]  FeFum-FePoly-FA-B Cmp-C-Biot (INTEGRA PLUS) CAPS Take 1 capsule by mouth daily. 11/12/21   Si Gaul, MD  gabapentin (NEURONTIN) 300 MG capsule Take 600 mg by mouth 2 (two) times daily. 07/13/21   [provider]  lactulose (CHRONULAC) 10 GM/15ML solution SMARTSIG:Milliliter(s) By Mouth 09/28/20   [provider]  LINZESS 290 MCG CAPS capsule Take 290 mcg by mouth as needed. 08/17/20   [provider]  lisinopril-hydrochlorothiazide  (PRINZIDE,ZESTORETIC) 10-12.5 MG tablet Take 1 tablet by mouth daily.    [provider]  meloxicam (MOBIC) 7.5 MG tablet Take 7.5 mg by mouth daily as needed. 03/27/21   [provider]  methocarbamol (ROBAXIN) 500 MG tablet Take 1 tablet (500 mg total) by mouth 2 (two) times daily. Patient taking differently: Take 500 mg by mouth as needed. 10/17/20   Henderly, Britni A, PA-C  Multiple Vitamins-Minerals (MULTIVITAMIN GUMMIES ADULT) CHEW Chew 1 tablet by mouth at bedtime.     [provider]  naproxen (NAPROSYN) 500 MG tablet Take 1 tablet (500 mg total) by mouth 2 (two) times daily. Patient taking differently: Take 500 mg by mouth as needed. 10/17/20   Henderly, Britni A, PA-C  oxybutynin (DITROPAN-XL) 10 MG 24 hr tablet Take 10 mg by mouth every morning. 07/09/22   [provider]  pantoprazole (PROTONIX) 20 MG tablet Take 2 tablets (40 mg total) by mouth daily. 05/06/23   Derwood Kaplan, MD  sucralfate (CARAFATE) 1 g tablet Take 1 tablet (1 g total) by mouth 4 (four) times daily -  with meals and at bedtime. 05/06/23   Derwood Kaplan, MD      Allergies    Phentermine    Review of Systems   Review of Systems  All other systems reviewed and are negative.   Physical Exam Updated Vital Signs BP (!) 160/109 (BP Location: Left Arm)  Pulse (!) 114   Temp 98.6 F (37 C) (Oral)   Resp 18   SpO2 98%  Physical Exam Vitals and nursing note reviewed.  Constitutional:      General: She is not in acute distress.    Appearance: Normal appearance. She is well-developed.  HENT:     Head: Normocephalic and atraumatic.  Eyes:     Conjunctiva/sclera: Conjunctivae normal.     Pupils: Pupils are equal, round, and reactive to light.  Cardiovascular:     Rate and Rhythm: Normal rate and regular rhythm.     Heart sounds: Normal heart sounds.  Pulmonary:     Effort: Pulmonary effort is normal. No respiratory distress.     Breath sounds: Normal breath sounds.   Abdominal:     General: There is no distension.     Palpations: Abdomen is soft.     Tenderness: There is no abdominal tenderness.  Musculoskeletal:        General: No deformity. Normal range of motion.     Cervical back: Normal range of motion and neck supple.     Comments: Diffuse predominantly paraspinal muscular tenderness with palpation of the lumbar spine area.  No discrete bony tenderness.  Patient is ambulatory.  5 out of 5 strength to both lower extremities.  Skin:    General: Skin is warm and dry.  Neurological:     General: No focal deficit present.     Mental Status: She is alert and oriented to person, place, and time.     ED Results / Procedures / Treatments   Labs (all labs ordered are listed, but only abnormal results are displayed) Labs Reviewed - No data to display  EKG None  Radiology No results found.  Procedures Procedures    Medications Ordered in ED Medications  ketorolac (TORADOL) 15 MG/ML injection 15 mg (has no administration in time range)    ED Course/ Medical Decision Making/ A&P                                 Medical Decision Making Amount and/or Complexity of Data Reviewed Radiology: ordered.  Risk Prescription drug management.    Medical Screen Complete  This patient presented to the ED with complaint of back pain after fall.  This complaint involves an extensive number of treatment options. The initial differential diagnosis includes, but is not limited to, traumatic injury related to fall  This presentation is: Acute, Self-Limited, Previously Undiagnosed, Uncertain Prognosis, and Complicated  Patient presents for evaluation of low back pain after fall from standing.  Fall occurred 3 days prior.  Patient complains of persistent low back discomfort.  Imaging obtained is without evidence of acute fracture.  Patient's described symptoms are most consistent with muscular strain.  Patient feels improved with treatment here in  the ED.    Importance of close follow-up is stressed.  Strict return precautions given and understood.  Co morbidities that complicated the patient's evaluation  See HPI   Additional history obtained:  External records from outside sources obtained and reviewed including prior ED visits and prior Inpatient records.    Problem List / ED Course:  Fall, Back pain   Reevaluation:  After the interventions noted above, I reevaluated the patient and found that they have: improved  Disposition:  After consideration of the diagnostic results and the patients response to treatment, I feel that the patent would benefit from close  outpatient followup.          Final Clinical Impression(s) / ED Diagnoses Final diagnoses:  Fall, initial encounter    Rx / DC Orders ED Discharge Orders     None         Wynetta Fines, MD 01/22/24 1958

## 2024-01-22 NOTE — Discharge Instructions (Signed)
 Return for any problem.  ?

## 2024-06-10 ENCOUNTER — Emergency Department (HOSPITAL_COMMUNITY)
Admission: EM | Admit: 2024-06-10 | Discharge: 2024-06-10 | Disposition: A | Discharge status: Home / Self Care | Attending: Emergency Medicine | Admitting: Emergency Medicine

## 2024-06-10 ENCOUNTER — Encounter (HOSPITAL_COMMUNITY): Payer: Self-pay

## 2024-06-10 ENCOUNTER — Other Ambulatory Visit: Payer: Self-pay

## 2024-06-10 ENCOUNTER — Emergency Department (HOSPITAL_COMMUNITY)

## 2024-06-10 DIAGNOSIS — R600 Localized edema: Secondary | ICD-10-CM | POA: Diagnosis not present

## 2024-06-10 DIAGNOSIS — M25562 Pain in left knee: Secondary | ICD-10-CM | POA: Insufficient documentation

## 2024-06-10 DIAGNOSIS — M7989 Other specified soft tissue disorders: Secondary | ICD-10-CM | POA: Diagnosis present

## 2024-06-10 DIAGNOSIS — Z7982 Long term (current) use of aspirin: Secondary | ICD-10-CM | POA: Diagnosis not present

## 2024-06-10 LAB — COMPREHENSIVE METABOLIC PANEL WITH GFR
ALT: 23 U/L (ref 0–44)
AST: 26 U/L (ref 15–41)
Albumin: 3.8 g/dL (ref 3.5–5.0)
Alkaline Phosphatase: 126 U/L (ref 38–126)
Anion gap: 12 (ref 5–15)
BUN: 15 mg/dL (ref 6–20)
CO2: 25 mmol/L (ref 22–32)
Calcium: 9.3 mg/dL (ref 8.9–10.3)
Chloride: 104 mmol/L (ref 98–111)
Creatinine, Ser: 0.94 mg/dL (ref 0.44–1.00)
GFR, Estimated: >60 mL/min (ref >60)
Glucose, Bld: 103 mg/dL — ABNORMAL HIGH (ref 70–99)
Potassium: 4.1 mmol/L (ref 3.5–5.1)
Sodium: 141 mmol/L (ref 135–145)
Total Bilirubin: 0.3 mg/dL (ref 0.0–1.2)
Total Protein: 8 g/dL (ref 6.5–8.1)

## 2024-06-10 LAB — CBC WITH DIFFERENTIAL/PLATELET
Abs Immature Granulocytes: 0.02 K/uL (ref 0.00–0.07)
Basophils Absolute: 0.1 K/uL (ref 0.0–0.1)
Basophils Relative: 1 %
Eosinophils Absolute: 0.2 K/uL (ref 0.0–0.5)
Eosinophils Relative: 2 %
HCT: 37.1 % (ref 36.0–46.0)
Hemoglobin: 11.6 g/dL — ABNORMAL LOW (ref 12.0–15.0)
Immature Granulocytes: 0 %
Lymphocytes Relative: 33 %
Lymphs Abs: 2.4 K/uL (ref 0.7–4.0)
MCH: 24.8 pg — ABNORMAL LOW (ref 26.0–34.0)
MCHC: 31.3 g/dL (ref 30.0–36.0)
MCV: 79.3 fL — ABNORMAL LOW (ref 80.0–100.0)
Monocytes Absolute: 0.7 K/uL (ref 0.1–1.0)
Monocytes Relative: 10 %
Neutro Abs: 4 K/uL (ref 1.7–7.7)
Neutrophils Relative %: 54 %
Platelets: 453 K/uL — ABNORMAL HIGH (ref 150–400)
RBC: 4.68 MIL/uL (ref 3.87–5.11)
RDW: 15.7 % — ABNORMAL HIGH (ref 11.5–15.5)
WBC: 7.4 K/uL (ref 4.0–10.5)
nRBC: 0 % (ref 0.0–0.2)

## 2024-06-10 NOTE — ED Provider Triage Note (Signed)
 Emergency Medicine Provider Triage Evaluation Note  Rhonda Young , a 53 y.o. female  was evaluated in triage.  Pt complains of leg pain. Intermittent pain to L leg x 1 month.  Increase pain in the past few days.  No fever, chills, cp, sob, or back pain.  Did report injury to L leg a month ago when it was hit by a car door.  No hx of DVT  Review of Systems  Positive: As above Negative: As above  Physical Exam  BP (!) 187/110 (BP Location: Left Arm)   Pulse 85   Temp 98.4 F (36.9 C) (Oral)   Resp 16   Ht 5' 2 (1.575 m)   Wt 108.9 kg   SpO2 100%   BMI 43.90 kg/m  Gen:   Awake, no distress   Resp:  Normal effort  MSK:   Moves extremities without difficulty  Other:    Medical Decision Making  Medically screening exam initiated at 6:00 PM.  Appropriate orders placed.  HARUYE LAINEZ was informed that the remainder of the evaluation will be completed by another provider, this initial triage assessment does not replace that evaluation, and the importance of remaining in the ED until their evaluation is complete.     Nivia Colon, PA-C 06/10/24 [phone]

## 2024-06-10 NOTE — ED Triage Notes (Signed)
 Pt presents to ED from home C/O L leg swelling and pain X 1 month. Pt states her leg got stuck in car door about a month ago, and then began having pain that will not go away.

## 2024-06-10 NOTE — ED Provider Notes (Signed)
 [place] EMERGENCY DEPARTMENT AT [facility] Provider Note   CSN: [id number] Arrival date & time: 06/10/24  1736     Patient presents with: Leg Swelling   Rhonda Young is a 53 y.o. female.   Patient has a history of left leg pain.  She injured it couple months ago.  The history is provided by the patient and medical records. No language interpreter was used.  Leg Pain Location:  Leg Injury: yes   Leg location:  L leg Pain details:    Quality:  Aching   Radiates to:  Does not radiate   Severity:  Moderate   Onset quality:  Sudden   Timing:  Constant   Progression:  Worsening Associated symptoms: no back pain and no fatigue        Prior to Admission medications   Medication Sig Start Date End Date Taking? Authorizing Provider  Aspirin-Salicylamide-Caffeine (BC HEADACHE POWDER PO) Take 1 packet by mouth daily as needed (headache & pain). For pain.    [provider]  baclofen (LIORESAL) 10 MG tablet Take 10 mg by mouth as needed. 11/10/20   [provider]  Cholecalciferol (VITAMIN D3) 1.25 MG (50000 UT) CAPS Take 1 capsule by mouth once a week.    [provider]  cyclobenzaprine (FLEXERIL) 5 MG tablet Take 5 mg by mouth at bedtime as needed. 03/27/21   [provider]  FeFum-FePoly-FA-B Cmp-C-Biot (INTEGRA PLUS ) CAPS Take 1 capsule by mouth daily. 11/12/21   Sherrod Sherrod, MD  gabapentin (NEURONTIN) 300 MG capsule Take 600 mg by mouth 2 (two) times daily. 07/13/21   [provider]  lactulose (CHRONULAC) 10 GM/15ML solution SMARTSIG:Milliliter(s) By Mouth 09/28/20   [provider]  LINZESS 290 MCG CAPS capsule Take 290 mcg by mouth as needed. 08/17/20   [provider]  lisinopril-hydrochlorothiazide (PRINZIDE,ZESTORETIC) 10-12.5 MG tablet Take 1 tablet by mouth daily.    [provider]  meloxicam (MOBIC) 7.5 MG tablet Take 7.5 mg by mouth daily as needed. 03/27/21   [provider]  methocarbamol  (ROBAXIN ) 500 MG tablet Take 1 tablet (500 mg total) by mouth 2 (two) times daily. Patient taking differently: Take 500 mg by mouth as needed. 10/17/20   Henderly, Britni A, PA-C  Multiple Vitamins-Minerals (MULTIVITAMIN GUMMIES ADULT) CHEW Chew 1 tablet by mouth at bedtime.     [provider]  naproxen  (NAPROSYN ) 500 MG tablet Take 1 tablet (500 mg total) by mouth 2 (two) times daily. Patient taking differently: Take 500 mg by mouth as needed. 10/17/20   Henderly, Britni A, PA-C  oxybutynin (DITROPAN-XL) 10 MG 24 hr tablet Take 10 mg by mouth every morning. 07/09/22   [provider]  pantoprazole  (PROTONIX ) 20 MG tablet Take 2 tablets (40 mg total) by mouth daily. 05/06/23   Charlyn Sora, MD  sucralfate  (CARAFATE ) 1 g tablet Take 1 tablet (1 g total) by mouth 4 (four) times daily -  with meals and at bedtime. 05/06/23   Charlyn Sora, MD    Allergies: Phentermine    Review of Systems  Constitutional:  Negative for appetite change and fatigue.  HENT:  Negative for congestion, ear discharge and sinus pressure.   Eyes:  Negative for discharge.  Respiratory:  Negative for cough.   Cardiovascular:  Negative for chest pain.  Gastrointestinal:  Negative for abdominal pain and diarrhea.  Genitourinary:  Negative for frequency and hematuria.  Musculoskeletal:  Negative for back pain.  Left lower leg pain  Skin:  Negative for rash.  Neurological:  Negative for seizures and headaches.  Psychiatric/Behavioral:  Negative for hallucinations.     Updated Vital Signs BP (!) 161/102   Pulse 78   Temp 98.1 F (36.7 C) (Oral)   Resp 18   Ht 5' 2 (1.575 m)   Wt 108.9 kg   SpO2 100%   BMI 43.90 kg/m   Physical Exam Vitals and nursing note reviewed.  Constitutional:      Appearance: She is well-developed.  HENT:     Head: Normocephalic.     Nose: Nose normal.  Eyes:     General: No scleral icterus.    Conjunctiva/sclera: Conjunctivae normal.   Neck:     Thyroid : No thyromegaly.  Cardiovascular:     Rate and Rhythm: Normal rate and regular rhythm.     Heart sounds: No murmur heard.    No friction rub. No gallop.  Pulmonary:     Breath sounds: No stridor. No wheezing or rales.  Chest:     Chest wall: No tenderness.  Abdominal:     General: There is no distension.     Tenderness: There is no abdominal tenderness. There is no rebound.  Musculoskeletal:        General: Normal range of motion.     Cervical back: Neck supple.     Comments: Mild tenderness and swelling to back of left knee  Lymphadenopathy:     Cervical: No cervical adenopathy.  Skin:    Findings: No erythema or rash.  Neurological:     Mental Status: She is alert and oriented to person, place, and time.     Motor: No abnormal muscle tone.     Coordination: Coordination normal.  Psychiatric:        Behavior: Behavior normal.     (all labs ordered are listed, but only abnormal results are displayed) Labs Reviewed  COMPREHENSIVE METABOLIC PANEL WITH GFR - Abnormal; Notable for the following components:      Result Value   Glucose, Bld 103 (*)    All other components within normal limits  CBC WITH DIFFERENTIAL/PLATELET - Abnormal; Notable for the following components:   Hemoglobin 11.6 (*)    MCV 79.3 (*)    MCH 24.8 (*)    RDW 15.7 (*)    Platelets 453 (*)    All other components within normal limits    EKG: None  Radiology: DG Knee Complete 4 Views Left Result Date: 06/10/2024 CLINICAL DATA:  Worsening left knee pain. EXAM: LEFT KNEE - COMPLETE 4+ VIEW COMPARISON:  None Available. FINDINGS: No evidence of fracture, dislocation, or joint effusion. No evidence of arthropathy or other focal bone abnormality. Soft tissues are unremarkable. IMPRESSION: Negative. Electronically Signed   By: Suzen Dials M.D.   On: 06/10/2024 21:45     Procedures   Medications Ordered in the ED - No data to display                                   Medical Decision Making Amount and/or Complexity of Data Reviewed Labs: ordered. Radiology: ordered.   Patient with mild swelling to left knee and pain behind her knee.  Labs and x-rays unremarkable.  Patient will be set up to get an ultrasound as an outpatient to rule out DVT     Final diagnoses:  Leg edema    ED  Discharge Orders          Ordered    UE VENOUS DUPLEX        06/10/24 2216               Suzette Pac, MD 06/12/24 1112

## 2024-06-10 NOTE — Discharge Instructions (Signed)
 Take Tylenol  or Motrin for pain and follow-up to get your ultrasound at [facility].

## 2024-06-11 ENCOUNTER — Other Ambulatory Visit (HOSPITAL_COMMUNITY): Payer: Self-pay

## 2024-06-11 ENCOUNTER — Encounter (HOSPITAL_COMMUNITY)

## 2024-06-11 ENCOUNTER — Ambulatory Visit (HOSPITAL_COMMUNITY)

## 2024-06-11 ENCOUNTER — Telehealth (HOSPITAL_COMMUNITY): Payer: Self-pay

## 2024-06-11 NOTE — Telephone Encounter (Signed)
 Patient here last night and had wrong ultrasound ordered. Needs a lower extremity doppler done. Will change the order.

## 2024-06-15 ENCOUNTER — Telehealth (HOSPITAL_COMMUNITY): Payer: Self-pay

## 2024-06-15 NOTE — Telephone Encounter (Signed)
 Attempted to contact the patient to schedule VAS US .  Yes answer.  Answered, Scheduled.  First Attempt. Provided  direct contact number for scheduling: [phone].   Patient requested delay to friday Informed that study is requested ASAP Patient still requesting delay Informed if changes to follow up with referring provider - as is ED suggested PCP  Reiterated the urgency of request. And informed if patient changes mind to call back to the number above.

## 2024-06-17 ENCOUNTER — Ambulatory Visit (HOSPITAL_COMMUNITY)
Admission: RE | Admit: 2024-06-17 | Discharge: 2024-06-17 | Disposition: A | Discharge status: Home / Self Care | Source: Ambulatory Visit

## 2024-06-17 DIAGNOSIS — M7989 Other specified soft tissue disorders: Secondary | ICD-10-CM

## 2024-06-17 DIAGNOSIS — R52 Pain, unspecified: Secondary | ICD-10-CM | POA: Insufficient documentation

## 2025-03-23 ENCOUNTER — Ambulatory Visit: Admitting: Dermatology
